# Patient Record
Sex: Female | Born: 1995 | Race: Black or African American | Hispanic: No | Marital: Single | Smoking: Never smoker
Health system: Southern US, Community
[De-identification: ages and names within clinical notes are randomized; demographics above are authoritative.]

## PROBLEM LIST (undated history)

## (undated) DIAGNOSIS — D649 Anemia, unspecified: Secondary | ICD-10-CM

## (undated) DIAGNOSIS — J45909 Unspecified asthma, uncomplicated: Secondary | ICD-10-CM

---

## 2014-07-02 ENCOUNTER — Other Ambulatory Visit: Payer: Self-pay | Admitting: *Deleted

## 2014-07-02 ENCOUNTER — Ambulatory Visit
Admission: RE | Admit: 2014-07-02 | Discharge: 2014-07-02 | Disposition: A | Discharge status: Home / Self Care | Payer: PRIVATE HEALTH INSURANCE | Source: Ambulatory Visit | Attending: *Deleted | Admitting: *Deleted

## 2014-07-02 DIAGNOSIS — R1010 Upper abdominal pain, unspecified: Secondary | ICD-10-CM

## 2014-07-02 DIAGNOSIS — R0789 Other chest pain: Secondary | ICD-10-CM

## 2015-05-10 ENCOUNTER — Encounter (HOSPITAL_COMMUNITY): Payer: Self-pay | Admitting: Emergency Medicine

## 2015-05-10 ENCOUNTER — Emergency Department (INDEPENDENT_AMBULATORY_CARE_PROVIDER_SITE_OTHER)
Admission: EM | Admit: 2015-05-10 | Discharge: 2015-05-10 | Disposition: A | Discharge status: Home / Self Care | Payer: PRIVATE HEALTH INSURANCE | Source: Home / Self Care | Attending: Family Medicine | Admitting: Family Medicine

## 2015-05-10 DIAGNOSIS — J111 Influenza due to unidentified influenza virus with other respiratory manifestations: Secondary | ICD-10-CM

## 2015-05-10 MED ORDER — ONDANSETRON HCL 4 MG PO TABS: 4.0000 mg | ORAL_TABLET | Freq: Four times a day (QID) | ORAL | Status: DC

## 2015-05-10 MED ORDER — OSELTAMIVIR PHOSPHATE 75 MG PO CAPS: 75.0000 mg | ORAL_CAPSULE | Freq: Two times a day (BID) | ORAL | Status: DC

## 2015-05-10 MED ORDER — ACETAMINOPHEN 325 MG PO TABS
ORAL_TABLET | ORAL | Status: AC
  Filled 2015-05-10: qty 2

## 2015-05-10 MED ORDER — ACETAMINOPHEN 325 MG PO TABS
650.0000 mg | ORAL_TABLET | Freq: Once | ORAL | Status: AC
  Administered 2015-05-10: 650 mg via ORAL

## 2015-05-10 NOTE — ED Notes (Signed)
Fever, chills, sweating, body aches, headache.  Complains of nausea, no vomiting, no diarrhea

## 2015-05-10 NOTE — Discharge Instructions (Signed)
Influenza, Adult Drink plenty of liquids and stay well-hydrated. Ibuprofen 600 mg every 6 hours May also alternate with Tylenol every 4 hours. Tamiflu as directed. For nausea may use Zofran every 6 hours as needed. Rest. No classes for the next 2 days minimum. If still having fever and 3 days no classes. Influenza ("the flu") is a viral infection of the respiratory tract. It occurs more often in winter months because people spend more time in close contact with one another. Influenza can make you feel very sick. Influenza easily spreads from person to person (contagious). CAUSES  Influenza is caused by a virus that infects the respiratory tract. You can catch the virus by breathing in droplets from an infected person's cough or sneeze. You can also catch the virus by touching something that was recently contaminated with the virus and then touching your mouth, nose, or eyes. RISKS AND COMPLICATIONS You may be at risk for a more severe case of influenza if you smoke cigarettes, have diabetes, have chronic heart disease (such as heart failure) or lung disease (such as asthma), or if you have a weakened immune system. Elderly people and pregnant women are also at risk for more serious infections. The most common problem of influenza is a lung infection (pneumonia). Sometimes, this problem can require emergency medical care and may be life threatening. SIGNS AND SYMPTOMS  Symptoms typically last 4 to 10 days and may include:  Fever.  Chills.  Headache, body aches, and muscle aches.  Sore throat.  Chest discomfort and cough.  Poor appetite.  Weakness or feeling tired.  Dizziness.  Nausea or vomiting. DIAGNOSIS  Diagnosis of influenza is often made based on your history and a physical exam. A nose or throat swab test can be done to confirm the diagnosis. TREATMENT  In mild cases, influenza goes away on its own. Treatment is directed at relieving symptoms. For more severe cases, your  health care provider may prescribe antiviral medicines to shorten the sickness. Antibiotic medicines are not effective because the infection is caused by a virus, not by bacteria. HOME CARE INSTRUCTIONS  Take medicines only as directed by your health care provider.  Use a cool mist humidifier to make breathing easier.  Get plenty of rest until your temperature returns to normal. This usually takes 3 to 4 days.  Drink enough fluid to keep your urine clear or pale yellow.  Cover yourmouth and nosewhen coughing or sneezing,and wash your handswellto prevent thevirusfrom spreading.  Stay homefromwork orschool untilthe fever is gonefor at least 65full day. PREVENTION  An annual influenza vaccination (flu shot) is the best way to avoid getting influenza. An annual flu shot is now routinely recommended for all adults in the [place] IF:  You experiencechest pain, yourcough worsens,or you producemore mucus.  Youhave nausea,vomiting, ordiarrhea.  Your fever returns or gets worse. SEEK IMMEDIATE MEDICAL CARE IF:  You havetrouble breathing, you become short of breath,or your skin ornails becomebluish.  You have severe painor stiffnessin the neck.  You develop a sudden headache, or pain in the face or ear.  You have nausea or vomiting that you cannot control. MAKE SURE YOU:   Understand these instructions.  Will watch your condition.  Will get help right away if you are not doing well or get worse.   This information is not intended to replace advice given to you by your health care provider. Make sure you discuss any questions you have with your health care provider.  Document Released: 03/03/2000 Document Revised: 03/27/2014 Document Reviewed: 06/05/2011 Elsevier Interactive Patient Education [employer].

## 2015-05-10 NOTE — ED Provider Notes (Signed)
CSN: [id number]     Arrival date & time 05/10/15  1802 History   First MD Initiated Contact with Patient 05/10/15 2000     Chief Complaint  Patient presents with  . Fever   (Consider location/radiation/quality/duration/timing/severity/associated sxs/prior Treatment) HPI Comments: 20 year old female resident of a [place] to be in college is complaining of feeling hot alternating with feeling cold, fever of 101.2, sweats, body aches, headache and nausea without vomiting. Denies abdominal pain. Denies diarrhea. Denies upper respiratory symptoms such as nasal congestion, runny nose or sore throat. Denies dysuria. Did not receive a flu shot this year. Current temp is 102.  Patient is a 20 y.o. female presenting with fever.  Fever Associated symptoms: chest pain, chills, cough, myalgias and nausea   Associated symptoms: no congestion, no ear pain, no rash, no rhinorrhea, no sore throat and no vomiting     History reviewed. No pertinent past medical history. History reviewed. No pertinent past surgical history. No family history on file. Social History  Substance Use Topics  . Smoking status: Never Smoker   . Smokeless tobacco: None  . Alcohol Use: Yes   OB History    No data available     Review of Systems  Constitutional: Positive for fever, chills, activity change, appetite change and fatigue.  HENT: Negative for congestion, ear discharge, ear pain, postnasal drip, rhinorrhea, sneezing and sore throat.   Eyes: Negative.   Respiratory: Positive for cough. Negative for shortness of breath and wheezing.   Cardiovascular: Positive for chest pain.  Gastrointestinal: Positive for nausea. Negative for vomiting and abdominal pain.  Musculoskeletal: Positive for myalgias.  Skin: Negative for pallor and rash.  Neurological: Negative.     Allergies  Review of patient's allergies indicates no known allergies.  Home Medications   Prior to Admission medications   Medication Sig Start  Date End Date Taking? Authorizing Provider  ondansetron (ZOFRAN) 4 MG tablet Take 1 tablet (4 mg total) by mouth every 6 (six) hours. 05/10/15   Janne Napoleon, NP  oseltamivir (TAMIFLU) 75 MG capsule Take 1 capsule (75 mg total) by mouth 2 (two) times daily. X 5 days 05/10/15   Janne Napoleon, NP   Meds Ordered and Administered this Visit   Medications  acetaminophen (TYLENOL) tablet 650 mg (650 mg Oral Given 05/10/15 2005)    BP 121/75 mmHg  Pulse 107  Temp(Src) 102 F (38.9 C) (Oral)  Resp 16  SpO2 100%  LMP 04/26/2015 No data found.   Physical Exam  Constitutional: She is oriented to person, place, and time. She appears well-developed and well-nourished. No distress.  HENT:  Mouth/Throat: No oropharyngeal exudate.  Bilateral TMs are normal. Oropharynx with minor PND.  Eyes: Conjunctivae and EOM are normal.  Neck: Normal range of motion. Neck supple.  Cardiovascular: Regular rhythm, normal heart sounds and intact distal pulses.   Atrial tachycardia at 107  Pulmonary/Chest: Effort normal and breath sounds normal. No respiratory distress. She has no wheezes. She has no rales.  Abdominal: Soft. Bowel sounds are normal. She exhibits no distension and no mass. There is no tenderness. There is no rebound and no guarding.  Musculoskeletal: She exhibits no edema.  Lymphadenopathy:    She has no cervical adenopathy.  Neurological: She is alert and oriented to person, place, and time. No cranial nerve deficit. She exhibits normal muscle tone.  Skin: Skin is warm and dry.  Psychiatric: She has a normal mood and affect.  Nursing note and vitals reviewed.   ED  Course  Procedures (including critical care time)  Labs Review Labs Reviewed - No data to display  Imaging Review No results found.   Visual Acuity Review  Right Eye Distance:   Left Eye Distance:   Bilateral Distance:    Right Eye Near:   Left Eye Near:    Bilateral Near:         MDM   1. Influenza    Influenza,  Adult Drink plenty of liquids and stay well-hydrated. Ibuprofen 600 mg every 6 hours May also alternate with Tylenol every 4 hours. Tamiflu as directed. For nausea may use Zofran every 6 hours as needed. Rest. No classes for the next 2 days minimum. If still having fever and 3 days no classes.    Janne Napoleon, NP 05/10/15 2028

## 2015-05-11 ENCOUNTER — Telehealth (HOSPITAL_COMMUNITY): Payer: Self-pay | Admitting: Emergency Medicine

## 2015-05-11 NOTE — ED Notes (Signed)
Pt called stating Rx were not e-Rx... Notified pt that the Rx were given to her on paper and she should have them Adv pt to call if she can't find them... Pt verb understanding.

## 2016-01-24 ENCOUNTER — Encounter (HOSPITAL_COMMUNITY): Payer: Self-pay | Admitting: Emergency Medicine

## 2016-01-24 ENCOUNTER — Ambulatory Visit (INDEPENDENT_AMBULATORY_CARE_PROVIDER_SITE_OTHER): Payer: 59

## 2016-01-24 ENCOUNTER — Ambulatory Visit (HOSPITAL_COMMUNITY)
Admission: EM | Admit: 2016-01-24 | Discharge: 2016-01-24 | Disposition: A | Discharge status: Home / Self Care | Payer: 59 | Attending: Family Medicine | Admitting: Family Medicine

## 2016-01-24 DIAGNOSIS — N939 Abnormal uterine and vaginal bleeding, unspecified: Secondary | ICD-10-CM

## 2016-01-24 DIAGNOSIS — R0789 Other chest pain: Secondary | ICD-10-CM | POA: Diagnosis not present

## 2016-01-24 LAB — POCT URINALYSIS DIP (DEVICE)
Bilirubin Urine: NEGATIVE
Glucose, UA: NEGATIVE mg/dL
KETONES UR: NEGATIVE mg/dL
Nitrite: NEGATIVE
PROTEIN: NEGATIVE mg/dL
SPECIFIC GRAVITY, URINE: 1.02 (ref 1.005–1.030)
UROBILINOGEN UA: 0.2 mg/dL (ref 0.0–1.0)
pH: 7.5 (ref 5.0–8.0)

## 2016-01-24 LAB — POCT I-STAT, CHEM 8
BUN: 4 mg/dL — AB (ref 6–20)
CALCIUM ION: 1.18 mmol/L (ref 1.15–1.40)
CHLORIDE: 102 mmol/L (ref 101–111)
Creatinine, Ser: 0.7 mg/dL (ref 0.44–1.00)
GLUCOSE: 94 mg/dL (ref 65–99)
HCT: 38 % (ref 36.0–46.0)
Hemoglobin: 12.9 g/dL (ref 12.0–15.0)
Potassium: 3.9 mmol/L (ref 3.5–5.1)
Sodium: 141 mmol/L (ref 135–145)
TCO2: 25 mmol/L (ref 0–100)

## 2016-01-24 LAB — POCT PREGNANCY, URINE: Preg Test, Ur: NEGATIVE

## 2016-01-24 MED ORDER — NAPROXEN 500 MG PO TABS: 500.0000 mg | ORAL_TABLET | Freq: Two times a day (BID) | ORAL | 0 refills | Status: DC

## 2016-01-24 MED ORDER — IBUPROFEN 800 MG PO TABS
ORAL_TABLET | ORAL | Status: AC
  Filled 2016-01-24: qty 1

## 2016-01-24 MED ORDER — CYCLOBENZAPRINE HCL 10 MG PO TABS: 10.0000 mg | ORAL_TABLET | Freq: Two times a day (BID) | ORAL | 0 refills | Status: DC | PRN

## 2016-01-24 MED ORDER — IBUPROFEN 800 MG PO TABS
800.0000 mg | ORAL_TABLET | Freq: Once | ORAL | Status: AC
  Administered 2016-01-24: 800 mg via ORAL

## 2016-01-24 NOTE — ED Triage Notes (Signed)
Patient reports pain under right breast with deep inspiration, movement and coughing.  Pain for 2 days.  Denies any injury.  Reports right shoulder pain.    Patient reports vaginal bleeding for two weeks.  Patient had a period in the beginning of October.  Patient is not on any birth control, reports no birth control pills in 1 1/2 years.  Patient reports this bleeding is abnormal for her.

## 2016-01-24 NOTE — ED Provider Notes (Signed)
CSN: [id number]     Arrival date & time 01/24/16  1046 History   First MD Initiated Contact with Patient 01/24/16 1307     No chief complaint on file.  (Consider location/radiation/quality/duration/timing/severity/associated sxs/prior Treatment) HPI Sonia James is a 20 y.o. female presenting to UC with c/o Right sided chest pain for about 2 days.  Pain is aching and sharp, worse with certain movements, palpation, cough and sneeze.  She reports mild intermittent chronic cough. No fever, chills, n/v/d. Denies recent injury of fall, heavy lifting or being hit in side. No hx of blood clots. Denies leg pain or swelling.  She also reports 2 week long vaginal bleeding and notes her cycles typically only last 4-5 days.  She has had heavier bleeding than normal as well as worsening menstrual cramps. Denies abdominal pain or lower back pain at this time. Denies concern for STI.    History reviewed. No pertinent past medical history. History reviewed. No pertinent surgical history. No family history on file. Social History  Substance Use Topics  . Smoking status: Never Smoker  . Smokeless tobacco: Not on file  . Alcohol use Yes   OB History    No data available     Review of Systems  Constitutional: Negative for chills and fever.  HENT: Positive for congestion ( minimal). Negative for ear pain, sore throat, trouble swallowing and voice change.   Respiratory: Positive for cough. Negative for shortness of breath.   Cardiovascular: Positive for chest pain (Right side, chest wall). Negative for palpitations.  Gastrointestinal: Negative for abdominal pain, diarrhea, nausea and vomiting.  Genitourinary: Positive for menstrual problem (heavier and longer than usual) and vaginal bleeding. Negative for dysuria, flank pain, frequency, hematuria, pelvic pain, urgency, vaginal discharge and vaginal pain.  Musculoskeletal: Negative for arthralgias, back pain and myalgias.  Skin: Negative for rash.     Allergies  Patient has no known allergies.  Home Medications   Prior to Admission medications   Medication Sig Start Date End Date Taking? Authorizing Provider  cyclobenzaprine (FLEXERIL) 10 MG tablet Take 1 tablet (10 mg total) by mouth 2 (two) times daily as needed for muscle spasms. 01/24/16   Noland Fordyce, PA-C  naproxen (NAPROSYN) 500 MG tablet Take 1 tablet (500 mg total) by mouth 2 (two) times daily. 01/24/16   Noland Fordyce, PA-C  ondansetron (ZOFRAN) 4 MG tablet Take 1 tablet (4 mg total) by mouth every 6 (six) hours. Patient not taking: Reported on 01/24/2016 05/10/15   Janne Napoleon, NP  oseltamivir (TAMIFLU) 75 MG capsule Take 1 capsule (75 mg total) by mouth 2 (two) times daily. X 5 days Patient not taking: Reported on 01/24/2016 05/10/15   Janne Napoleon, NP   Meds Ordered and Administered this Visit   Medications  ibuprofen (ADVIL,MOTRIN) tablet 800 mg (800 mg Oral Given 01/24/16 1322)    BP 151/85 (BP Location: Right Arm)   Pulse 83   Temp 99.1 F (37.3 C) (Oral)   Resp 16   LMP 12/27/2015   SpO2 100%  No data found.   Physical Exam  Constitutional: She appears well-developed and well-nourished. No distress.  HENT:  Head: Normocephalic and atraumatic.  Eyes: Conjunctivae are normal. No scleral icterus.  Neck: Normal range of motion.  Cardiovascular: Normal rate, regular rhythm and normal heart sounds.   Pulmonary/Chest: Effort normal and breath sounds normal. No respiratory distress. She has no decreased breath sounds. She has no wheezes. She has no rhonchi. She has no rales. She exhibits  tenderness.    No respiratory distress. Lungs: CTAB No chest wall deformity or crepitus. Tenderness to Right side chest wall.  Abdominal: Soft. Bowel sounds are normal. She exhibits no distension and no mass. There is no tenderness. There is no rebound and no guarding.  Musculoskeletal: Normal range of motion. She exhibits tenderness. She exhibits no edema.  Right side chest  wall, and Right upper back muscle tenderness.  Neurological: She is alert.  Skin: Skin is warm and dry. Capillary refill takes less than 2 seconds. No rash noted. She is not diaphoretic. No erythema.  Nursing note and vitals reviewed.   Urgent Care Course   Clinical Course     Procedures (including critical care time)  Labs Review Labs Reviewed  POCT I-STAT, CHEM 8 - Abnormal; Notable for the following:       Result Value   BUN 4 (*)    All other components within normal limits  POCT PREGNANCY, URINE    Imaging Review Dg Ribs Unilateral W/chest Right  Result Date: 01/24/2016 CLINICAL DATA:  Sharp pains under the ribcage on the right for the past 2 days associated with cough. No known injury. EXAM: RIGHT RIBS AND CHEST - 3+ VIEW COMPARISON:  None in PACs FINDINGS: The lungs are well-expanded and clear. The heart and pulmonary vascularity are normal. The mediastinum is normal in width. There is no pleural effusion, pneumothorax, or pneumomediastinum. Right rib detail images reveal no acute or healing fracture. No lytic or blastic right rib fracture is observed. IMPRESSION: No active cardiopulmonary disease. The observed right ribs exhibit no acute abnormalities. Electronically Signed   By: David  Martinique M.D.   On: 01/24/2016 13:46      MDM   1. Right-sided chest wall pain   2. Abnormal vaginal bleeding     Pt c/o Right side chest wall pain but denies SOB. No hx of blood clots. PERC negative  Mild cough and congestion. Pt also c/o vaginal bleeding longer than normal menstrual cycle.  Hx of anemia but denies dizziness or chest pain.  UA: not concerning for UTI Urine preg: Negative  istat chem 8: unremarkable. Normal Hgb/Hct. CXR: WNL  Discussed labs and imaging with pt. Encouraged f/u with PCP in 1 week as well as f/u with OB/GYN for further evaluation and treatment of vaginal bleeding.    Noland Fordyce, PA-C 01/24/16 1400

## 2016-01-24 NOTE — ED Notes (Signed)
Urinalysis results not crossing over due to time change: Results as follows: GLU-Neg, BIL-Neg, KET-Neg, SG-1.020, BLO-Moderate, pH-7.5, PRO-Neg, URO-0.2 E.U./dL, NIT-Neg, LEU-Small. Results were printed and shown to provider.

## 2016-02-02 ENCOUNTER — Ambulatory Visit (HOSPITAL_COMMUNITY)
Admission: EM | Admit: 2016-02-02 | Discharge: 2016-02-02 | Disposition: A | Discharge status: Home / Self Care | Payer: 59 | Attending: Emergency Medicine | Admitting: Emergency Medicine

## 2016-02-02 ENCOUNTER — Ambulatory Visit (INDEPENDENT_AMBULATORY_CARE_PROVIDER_SITE_OTHER): Payer: 59

## 2016-02-02 ENCOUNTER — Ambulatory Visit (HOSPITAL_COMMUNITY): Payer: 59

## 2016-02-02 ENCOUNTER — Encounter (HOSPITAL_COMMUNITY): Payer: Self-pay | Admitting: Emergency Medicine

## 2016-02-02 DIAGNOSIS — R1031 Right lower quadrant pain: Secondary | ICD-10-CM

## 2016-02-02 DIAGNOSIS — R1013 Epigastric pain: Secondary | ICD-10-CM

## 2016-02-02 LAB — POCT URINALYSIS DIP (DEVICE)
Bilirubin Urine: NEGATIVE
Glucose, UA: NEGATIVE mg/dL
KETONES UR: NEGATIVE mg/dL
Nitrite: NEGATIVE
PH: 7 (ref 5.0–8.0)
PROTEIN: NEGATIVE mg/dL
SPECIFIC GRAVITY, URINE: 1.025 (ref 1.005–1.030)
Urobilinogen, UA: 0.2 mg/dL (ref 0.0–1.0)

## 2016-02-02 LAB — POCT PREGNANCY, URINE: PREG TEST UR: NEGATIVE

## 2016-02-02 NOTE — ED Provider Notes (Signed)
CSN: [id number]     Arrival date & time 02/02/16  [id number] History   First MD Initiated Contact with Patient 02/02/16 1025     Chief Complaint  Patient presents with  . Abdominal Pain   (Consider location/radiation/quality/duration/timing/severity/associated sxs/prior Treatment) Sonia James is a well-appearing 20 y.o female, presents today for abdominal pain at the epigastric area and at the RLQ area. Abdominal pain have been present for 2 days. Onset was sudden, and patient reports the pain is getting worst. Patient describes the pain as stabbing and constant. Patient rates her pain as 7/10 at rest and 9/10 with movements. Patient reports the pain is worst with movements such as laying down or getting up from a supine position. Patient endorses 2-3 episodes of loose diarrhea for the past 2 days. She denies nausea or vomiting. She also denies fever, dysuria, urinary frequency, flank pain, vaginal bleeding or discharge.       History reviewed. No pertinent past medical history. History reviewed. No pertinent surgical history. No family history on file. Social History  Substance Use Topics  . Smoking status: Never Smoker  . Smokeless tobacco: Not on file  . Alcohol use Yes   OB History    No data available     Review of Systems  Constitutional: Negative for chills, fatigue and fever.  HENT: Positive for congestion and rhinorrhea. Negative for sneezing.   Respiratory: Positive for cough. Negative for shortness of breath.   Cardiovascular: Negative for chest pain and leg swelling.  Gastrointestinal: Positive for abdominal pain and diarrhea. Negative for nausea and vomiting.  Genitourinary: Negative for dysuria, flank pain and vaginal bleeding.  Skin: Negative for rash.  Neurological: Negative for syncope and headaches.    Allergies  Patient has no known allergies.  Home Medications   Prior to Admission medications   Medication Sig Start Date End Date Taking? Authorizing Provider   cyclobenzaprine (FLEXERIL) 10 MG tablet Take 1 tablet (10 mg total) by mouth 2 (two) times daily as needed for muscle spasms. 01/24/16   Noland Fordyce, PA-C  naproxen (NAPROSYN) 500 MG tablet Take 1 tablet (500 mg total) by mouth 2 (two) times daily. 01/24/16   Noland Fordyce, PA-C  ondansetron (ZOFRAN) 4 MG tablet Take 1 tablet (4 mg total) by mouth every 6 (six) hours. Patient not taking: Reported on 01/24/2016 05/10/15   Janne Napoleon, NP  oseltamivir (TAMIFLU) 75 MG capsule Take 1 capsule (75 mg total) by mouth 2 (two) times daily. X 5 days Patient not taking: Reported on 01/24/2016 05/10/15   Janne Napoleon, NP   Meds Ordered and Administered this Visit  Medications - No data to display  BP 132/77 (BP Location: Right Arm)   Pulse 100   Temp 99.1 F (37.3 C) (Oral)   Resp 16   LMP 12/27/2015   SpO2 100%  No data found.   Physical Exam  Constitutional: She is oriented to person, place, and time. She appears well-developed and well-nourished.  HENT:  Head: Normocephalic and atraumatic.  Right Ear: External ear normal.  Left Ear: External ear normal.  Nose: Nose normal.  Mouth/Throat: Oropharynx is clear and moist. No oropharyngeal exudate.  TM normal bilaterally  Eyes: Conjunctivae and EOM are normal. Pupils are equal, round, and reactive to light.  Neck: Normal range of motion. Neck supple.  Cardiovascular: Normal rate, regular rhythm and normal heart sounds.   Pulmonary/Chest: Effort normal and breath sounds normal. No respiratory distress.  Abdominal: Soft. Bowel sounds are normal. She exhibits no  distension. There is tenderness.  Generalized tenderness present with palpation  Lymphadenopathy:    She has no cervical adenopathy.  Neurological: She is alert and oriented to person, place, and time.  Skin: Skin is warm and dry.    Urgent Care Course   Clinical Course     Procedures (including critical care time)  Labs Review Labs Reviewed  POCT URINALYSIS DIP (DEVICE) -  Abnormal; Notable for the following:       Result Value   Hgb urine dipstick TRACE (*)    Leukocytes, UA SMALL (*)    All other components within normal limits  URINE CULTURE  POCT PREGNANCY, URINE    Imaging Review Dg Abd 2 Views  Result Date: 02/02/2016 CLINICAL DATA:  Acute right lower quadrant abdominal pain. EXAM: ABDOMEN - 2 VIEW COMPARISON:  None. FINDINGS: The bowel gas pattern is normal. There is no evidence of free air. No radio-opaque calculi or other significant radiographic abnormality is seen. IMPRESSION: No evidence of bowel obstruction or ileus. Electronically Signed   By: Marijo Conception, M.D.   On: 02/02/2016 11:47    MDM   1. Epigastric pain   2. Right lower quadrant abdominal pain    DG Abd 2 Views unremarkable. UA has trace of hgb and small amount of leukocytes. Patient denies urinary symptoms. Urine culture ordered and is pending; will hold off abx until culture comes back. Patient safe to be discharge home. Instructed to f/u with student health services if abdominal pain does not resolved. Informed to go to ER if pain gets worst or if new symptoms develop such as fever or vomiting. Patient states understanding. Discharge paperwork given.      Barry Dienes, NP 02/02/16 [phone]

## 2016-02-02 NOTE — ED Triage Notes (Signed)
Patient reports yellowish /green diarrhea and then blackish/brown color of diarrhea, alternating.  Has epigastric pain and lower right quadrant pain.  Denies urinary symptoms.  No vomiting

## 2016-02-02 NOTE — Discharge Instructions (Signed)
Your abdominal xray is negative. Your are not pregnant. Your urine may indicate a possible urinary tract infection however you are asymptomatic and I don't think you have an UTI, and we will send your urine off for a urine culture to confirm this. If culture comes back positive for UTI, then we will call you in an antibiotic. We will only call you if the culture comes back positive. I feel safe to discharge you home. If your pain does not improve, then you need to follow up with the student health services. If you pain gets worst, or if you starts to vomit, then you need to go to the ER.

## 2018-04-23 IMAGING — DX DG RIBS W/ CHEST 3+V*R*
4 series · 4 of 4 positions shown · non-contrast
Comparison: None in PACs

CLINICAL DATA: Sharp pains under the ribcage on the right for the
past 2 days associated with cough. No known injury.

EXAM:
RIGHT RIBS AND CHEST - 3+ VIEW

[chest pa]
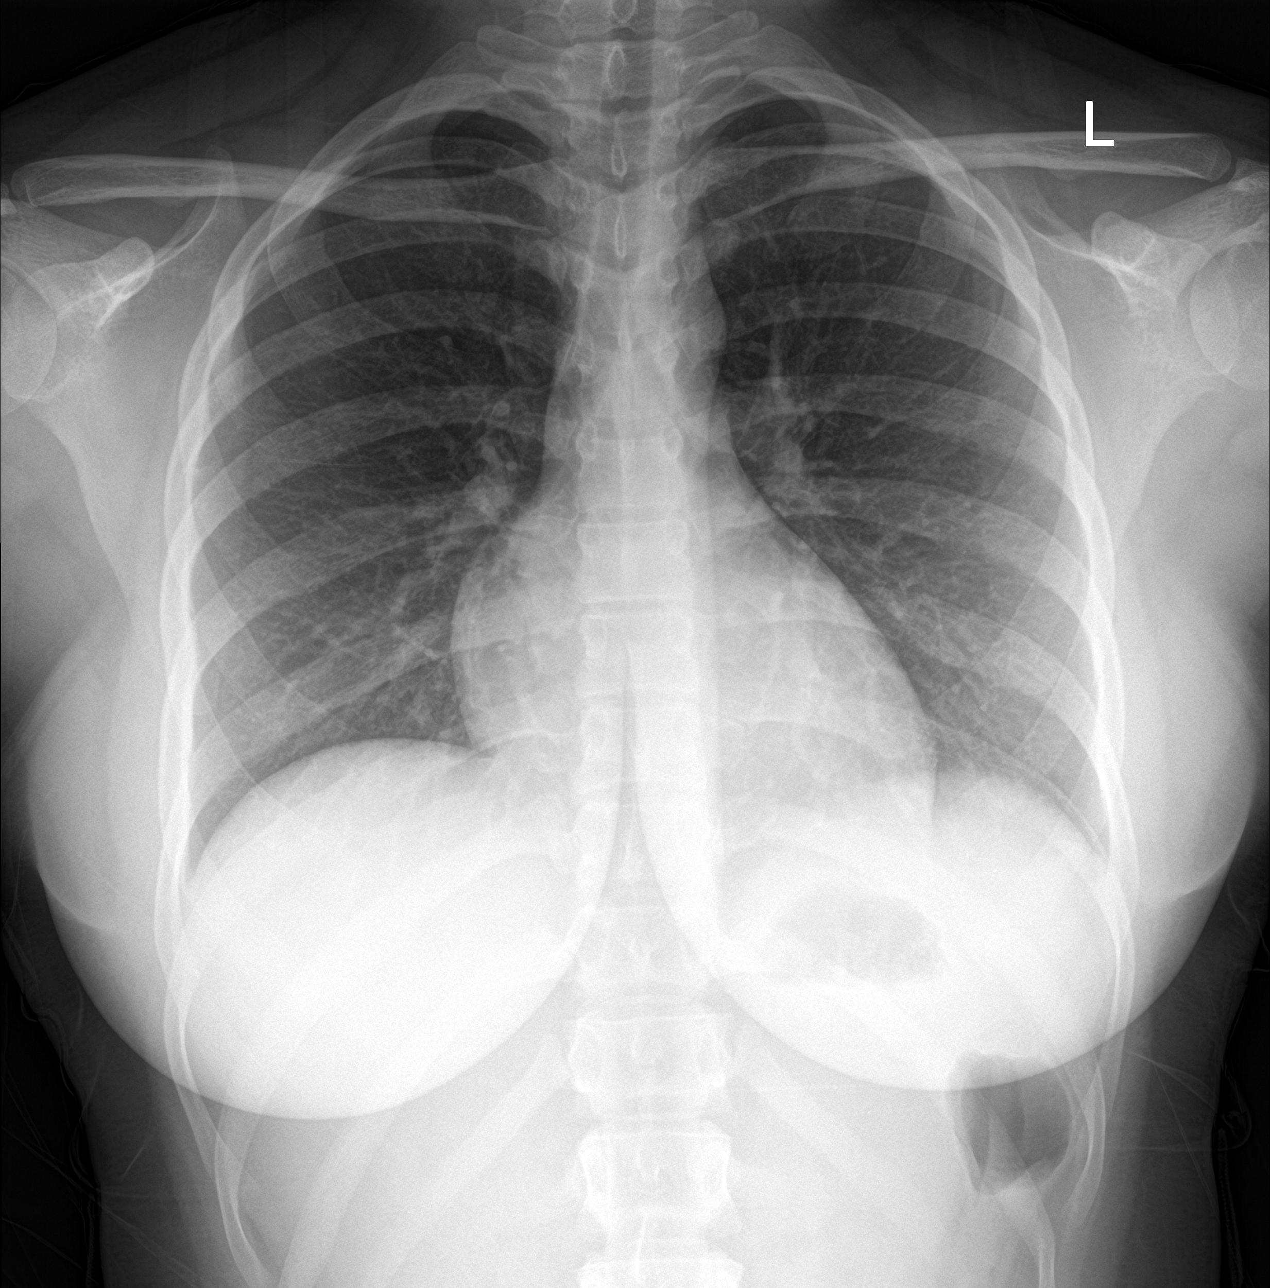

[rib pa]
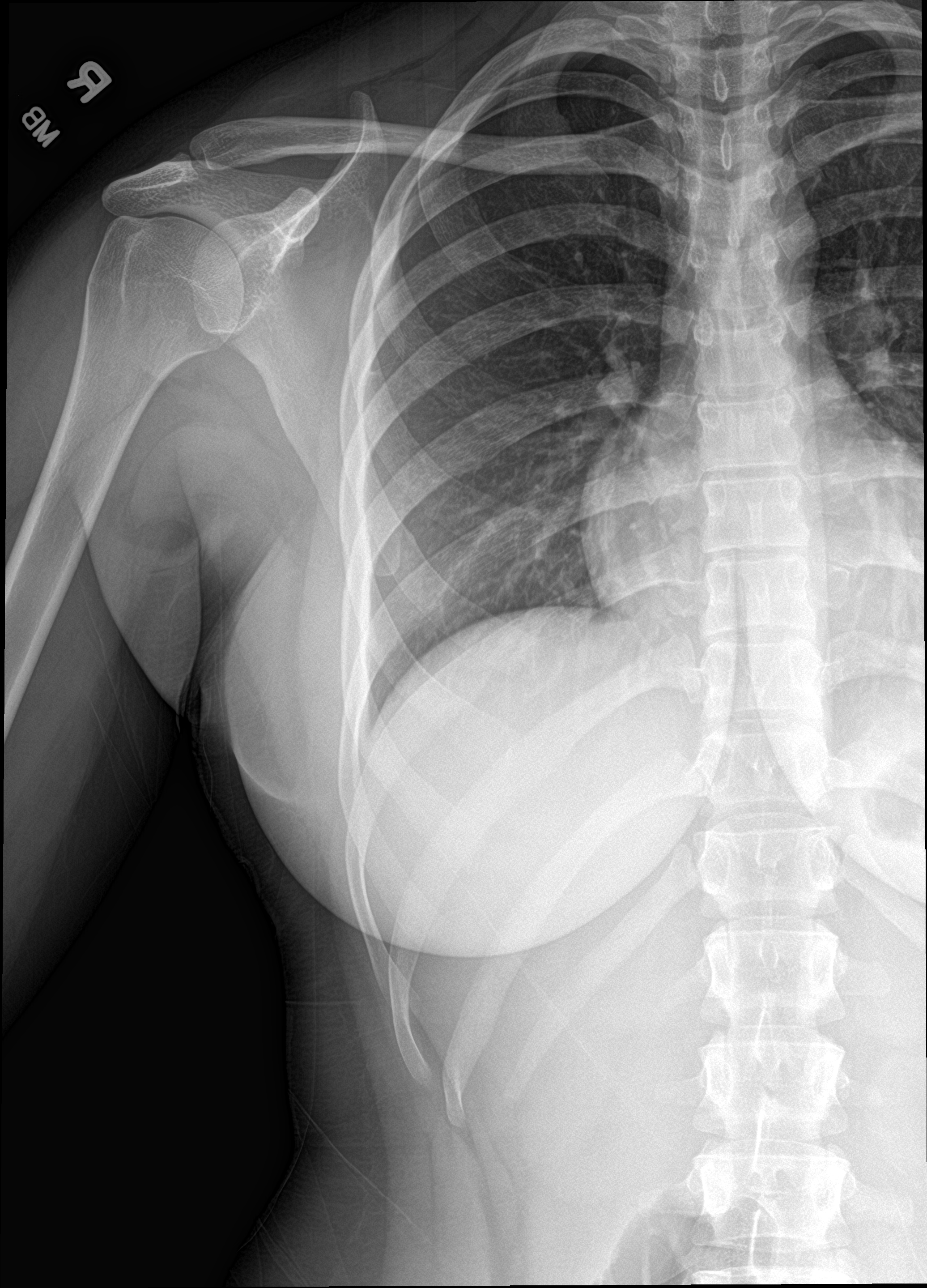

[rib obl]
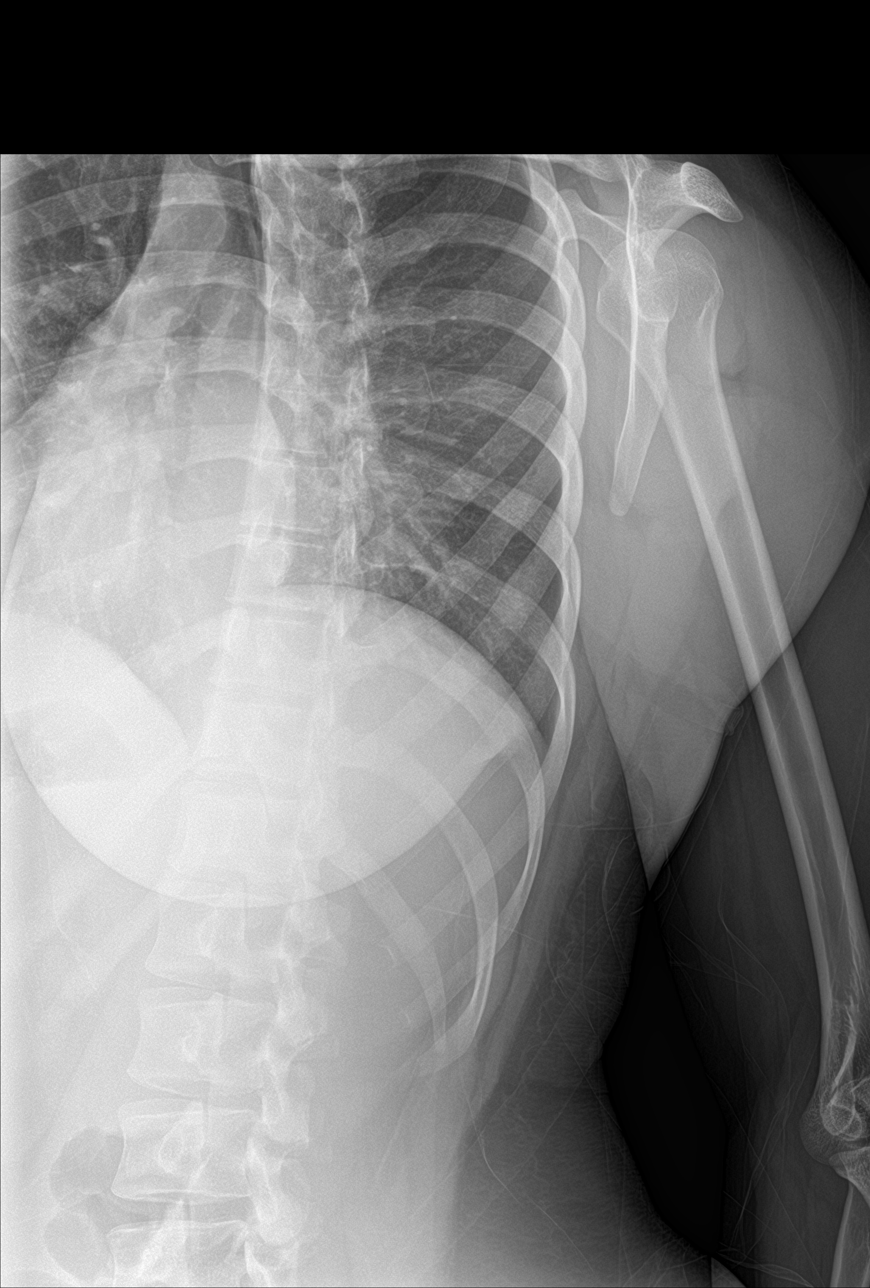

[chest ap]
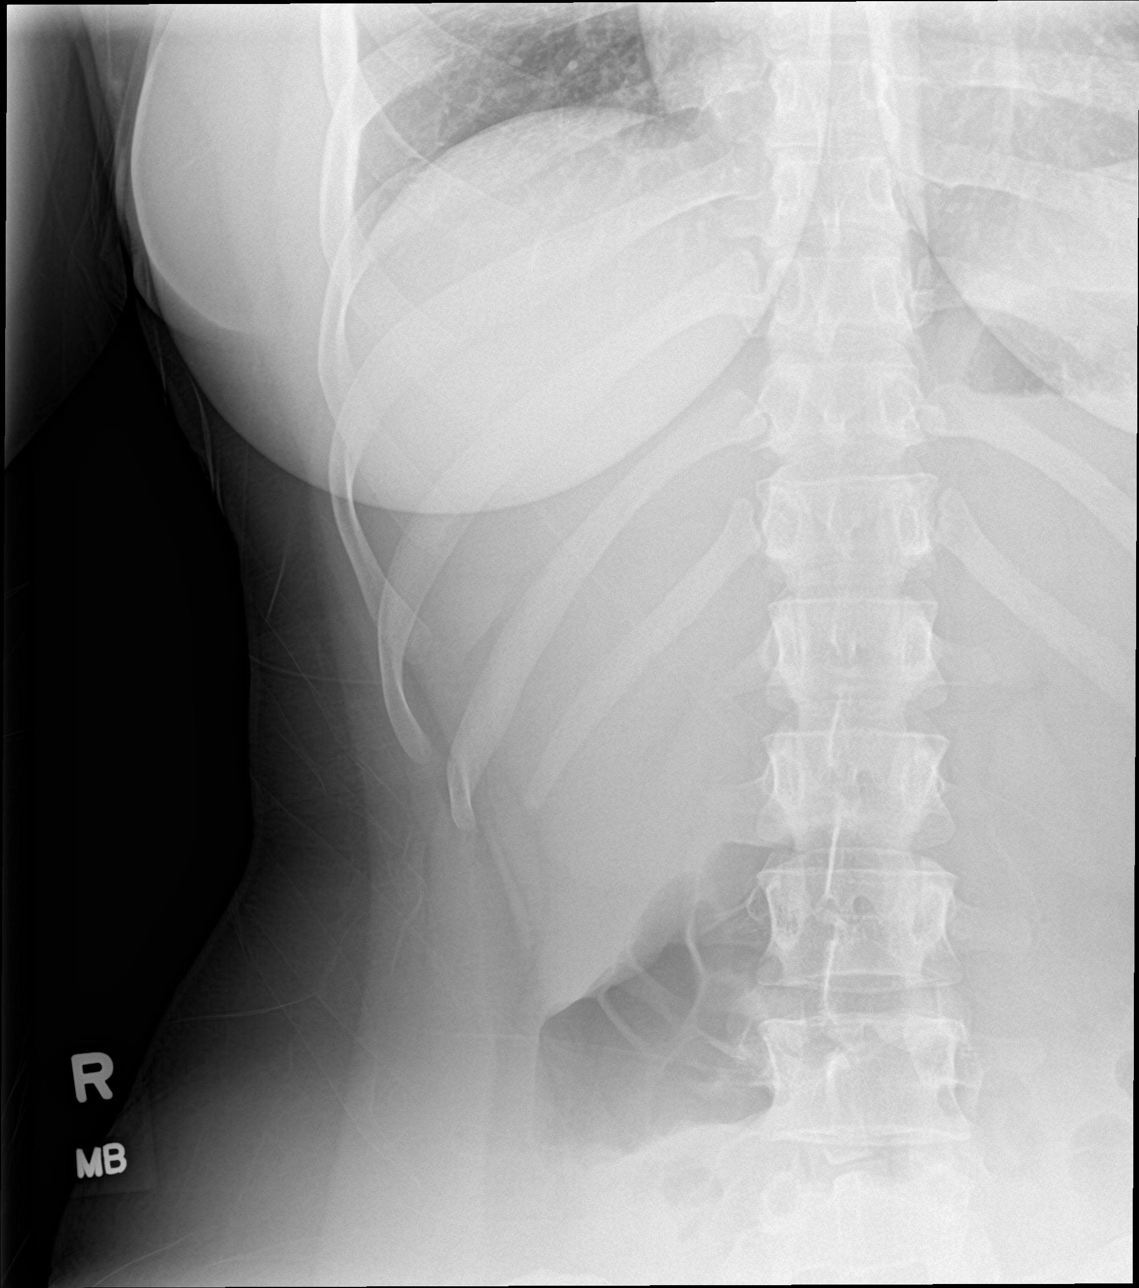

[4 of 4 positions shown; findings below may reference images not displayed]

FINDINGS: The lungs are well-expanded and clear. The heart and pulmonary
vascularity are normal. The mediastinum is normal in width. There is
no pleural effusion, pneumothorax, or pneumomediastinum.

Right rib detail images reveal no acute or healing fracture. No
lytic or blastic right rib fracture is observed.
IMPRESSION: No active cardiopulmonary disease. The observed right ribs exhibit
no acute abnormalities.

## 2020-03-29 ENCOUNTER — Inpatient Hospital Stay (HOSPITAL_COMMUNITY)
Admission: AD | Admit: 2020-03-29 | Discharge: 2020-03-31 | DRG: 787 | Disposition: A | Discharge status: Home / Self Care | Payer: Medicaid Other | Attending: Family Medicine | Admitting: Family Medicine

## 2020-03-29 ENCOUNTER — Inpatient Hospital Stay (HOSPITAL_BASED_OUTPATIENT_CLINIC_OR_DEPARTMENT_OTHER): Payer: Medicaid Other

## 2020-03-29 ENCOUNTER — Encounter (HOSPITAL_COMMUNITY): Payer: Self-pay | Admitting: Obstetrics and Gynecology

## 2020-03-29 ENCOUNTER — Inpatient Hospital Stay (HOSPITAL_COMMUNITY): Payer: Medicaid Other | Admitting: Certified Registered Nurse Anesthetist

## 2020-03-29 ENCOUNTER — Encounter (HOSPITAL_COMMUNITY)
Admission: AD | Disposition: A | Discharge status: Home / Self Care | Payer: Self-pay | Source: Home / Self Care | Attending: Family Medicine

## 2020-03-29 ENCOUNTER — Other Ambulatory Visit: Payer: Self-pay

## 2020-03-29 DIAGNOSIS — O9832 Other infections with a predominantly sexual mode of transmission complicating childbirth: Secondary | ICD-10-CM | POA: Diagnosis present

## 2020-03-29 DIAGNOSIS — O321XX0 Maternal care for breech presentation, not applicable or unspecified: Secondary | ICD-10-CM | POA: Diagnosis present

## 2020-03-29 DIAGNOSIS — O480 Post-term pregnancy: Principal | ICD-10-CM | POA: Diagnosis present

## 2020-03-29 DIAGNOSIS — Z30430 Encounter for insertion of intrauterine contraceptive device: Secondary | ICD-10-CM

## 2020-03-29 DIAGNOSIS — D259 Leiomyoma of uterus, unspecified: Secondary | ICD-10-CM | POA: Diagnosis present

## 2020-03-29 DIAGNOSIS — Z975 Presence of (intrauterine) contraceptive device: Secondary | ICD-10-CM | POA: Diagnosis not present

## 2020-03-29 DIAGNOSIS — A5602 Chlamydial vulvovaginitis: Secondary | ICD-10-CM | POA: Diagnosis present

## 2020-03-29 DIAGNOSIS — O3413 Maternal care for benign tumor of corpus uteri, third trimester: Secondary | ICD-10-CM | POA: Diagnosis present

## 2020-03-29 DIAGNOSIS — A5901 Trichomonal vulvovaginitis: Secondary | ICD-10-CM | POA: Diagnosis present

## 2020-03-29 DIAGNOSIS — O99214 Obesity complicating childbirth: Secondary | ICD-10-CM | POA: Diagnosis present

## 2020-03-29 DIAGNOSIS — O0933 Supervision of pregnancy with insufficient antenatal care, third trimester: Secondary | ICD-10-CM

## 2020-03-29 DIAGNOSIS — Z23 Encounter for immunization: Secondary | ICD-10-CM | POA: Diagnosis not present

## 2020-03-29 DIAGNOSIS — A599 Trichomoniasis, unspecified: Secondary | ICD-10-CM

## 2020-03-29 DIAGNOSIS — Z3A41 41 weeks gestation of pregnancy: Secondary | ICD-10-CM

## 2020-03-29 DIAGNOSIS — Z98891 History of uterine scar from previous surgery: Secondary | ICD-10-CM | POA: Diagnosis not present

## 2020-03-29 DIAGNOSIS — O9833 Other infections with a predominantly sexual mode of transmission complicating the puerperium: Secondary | ICD-10-CM | POA: Diagnosis not present

## 2020-03-29 DIAGNOSIS — A749 Chlamydial infection, unspecified: Secondary | ICD-10-CM | POA: Diagnosis not present

## 2020-03-29 DIAGNOSIS — Z20822 Contact with and (suspected) exposure to covid-19: Secondary | ICD-10-CM | POA: Diagnosis present

## 2020-03-29 DIAGNOSIS — O0930 Supervision of pregnancy with insufficient antenatal care, unspecified trimester: Secondary | ICD-10-CM

## 2020-03-29 HISTORY — DX: Anemia, unspecified: D64.9

## 2020-03-29 LAB — DIFFERENTIAL
Abs Immature Granulocytes: 0.06 10*3/uL (ref 0.00–0.07)
Basophils Absolute: 0 10*3/uL (ref 0.0–0.1)
Basophils Relative: 0 %
Eosinophils Absolute: 0 10*3/uL (ref 0.0–0.5)
Eosinophils Relative: 0 %
Immature Granulocytes: 1 %
Lymphocytes Relative: 18 %
Lymphs Abs: 1.8 10*3/uL (ref 0.7–4.0)
Monocytes Absolute: 0.7 10*3/uL (ref 0.1–1.0)
Monocytes Relative: 7 %
Neutro Abs: 7.5 10*3/uL (ref 1.7–7.7)
Neutrophils Relative %: 74 %

## 2020-03-29 LAB — RESP PANEL BY RT-PCR (FLU A&B, COVID) ARPGX2
Influenza A by PCR: NEGATIVE
Influenza B by PCR: NEGATIVE
SARS Coronavirus 2 by RT PCR: NEGATIVE

## 2020-03-29 LAB — GLUCOSE, CAPILLARY: Glucose-Capillary: 64 mg/dL — ABNORMAL LOW (ref 70–99)

## 2020-03-29 LAB — TYPE AND SCREEN
ABO/RH(D): O POS
Antibody Screen: NEGATIVE

## 2020-03-29 LAB — URINALYSIS, ROUTINE W REFLEX MICROSCOPIC
Bilirubin Urine: NEGATIVE
Glucose, UA: NEGATIVE mg/dL
Hgb urine dipstick: NEGATIVE
Ketones, ur: NEGATIVE mg/dL
Nitrite: NEGATIVE
Protein, ur: NEGATIVE mg/dL
Specific Gravity, Urine: 1.018 (ref 1.005–1.030)
pH: 6 (ref 5.0–8.0)

## 2020-03-29 LAB — HEPATITIS B SURFACE ANTIGEN: Hepatitis B Surface Ag: NONREACTIVE

## 2020-03-29 LAB — CBC
HCT: 33.9 % — ABNORMAL LOW (ref 36.0–46.0)
Hemoglobin: 10.4 g/dL — ABNORMAL LOW (ref 12.0–15.0)
MCH: 24.9 pg — ABNORMAL LOW (ref 26.0–34.0)
MCHC: 30.7 g/dL (ref 30.0–36.0)
MCV: 81.1 fL (ref 80.0–100.0)
Platelets: 454 10*3/uL — ABNORMAL HIGH (ref 150–400)
RBC: 4.18 MIL/uL (ref 3.87–5.11)
RDW: 16.5 % — ABNORMAL HIGH (ref 11.5–15.5)
WBC: 10 10*3/uL (ref 4.0–10.5)
nRBC: 0 % (ref 0.0–0.2)

## 2020-03-29 LAB — HEMOGLOBIN A1C
Hgb A1c MFr Bld: 5.4 % (ref 4.8–5.6)
Mean Plasma Glucose: 108.28 mg/dL

## 2020-03-29 LAB — HIV ANTIBODY (ROUTINE TESTING W REFLEX): HIV Screen 4th Generation wRfx: NONREACTIVE

## 2020-03-29 SURGERY — Surgical Case
Anesthesia: Spinal | Site: Abdomen | Wound class: Clean Contaminated

## 2020-03-29 MED ORDER — KETOROLAC TROMETHAMINE 30 MG/ML IJ SOLN: 30.0000 mg | Freq: Four times a day (QID) | INTRAMUSCULAR | Status: DC | PRN

## 2020-03-29 MED ORDER — DIPHENHYDRAMINE HCL 25 MG PO CAPS: 25.0000 mg | ORAL_CAPSULE | ORAL | Status: DC | PRN

## 2020-03-29 MED ORDER — FENTANYL CITRATE (PF) 100 MCG/2ML IJ SOLN
INTRAMUSCULAR | Status: DC | PRN
  Administered 2020-03-29: 35 ug via INTRAVENOUS

## 2020-03-29 MED ORDER — MENTHOL 3 MG MT LOZG: 1.0000 | LOZENGE | OROMUCOSAL | Status: DC | PRN

## 2020-03-29 MED ORDER — NALBUPHINE HCL 10 MG/ML IJ SOLN: 5.0000 mg | INTRAMUSCULAR | Status: DC | PRN

## 2020-03-29 MED ORDER — COCONUT OIL OIL: 1.0000 "application " | TOPICAL_OIL | Status: DC | PRN

## 2020-03-29 MED ORDER — DEXAMETHASONE SODIUM PHOSPHATE 4 MG/ML IJ SOLN
INTRAMUSCULAR | Status: AC
  Filled 2020-03-29: qty 1

## 2020-03-29 MED ORDER — ONDANSETRON HCL 4 MG/2ML IJ SOLN: 4.0000 mg | Freq: Three times a day (TID) | INTRAMUSCULAR | Status: DC | PRN

## 2020-03-29 MED ORDER — ONDANSETRON HCL 4 MG/2ML IJ SOLN
INTRAMUSCULAR | Status: DC | PRN
  Administered 2020-03-29: 4 mg via INTRAVENOUS

## 2020-03-29 MED ORDER — IBUPROFEN 800 MG PO TABS
800.0000 mg | ORAL_TABLET | Freq: Three times a day (TID) | ORAL | Status: DC
  Administered 2020-03-30 – 2020-03-31 (×4): 800 mg via ORAL
  Filled 2020-03-29 (×4): qty 1

## 2020-03-29 MED ORDER — NALBUPHINE HCL 10 MG/ML IJ SOLN: 5.0000 mg | Freq: Once | INTRAMUSCULAR | Status: DC | PRN

## 2020-03-29 MED ORDER — PHENYLEPHRINE HCL-NACL 20-0.9 MG/250ML-% IV SOLN
INTRAVENOUS | Status: DC | PRN
  Administered 2020-03-29: 60 ug/min via INTRAVENOUS

## 2020-03-29 MED ORDER — DIPHENHYDRAMINE HCL 50 MG/ML IJ SOLN: 12.5000 mg | INTRAMUSCULAR | Status: DC | PRN

## 2020-03-29 MED ORDER — OXYTOCIN-SODIUM CHLORIDE 30-0.9 UT/500ML-% IV SOLN
INTRAVENOUS | Status: AC
  Filled 2020-03-29: qty 500

## 2020-03-29 MED ORDER — SODIUM CHLORIDE 0.9% FLUSH: 3.0000 mL | INTRAVENOUS | Status: DC | PRN

## 2020-03-29 MED ORDER — MEPERIDINE HCL 25 MG/ML IJ SOLN: 6.2500 mg | INTRAMUSCULAR | Status: DC | PRN

## 2020-03-29 MED ORDER — KETOROLAC TROMETHAMINE 30 MG/ML IJ SOLN
INTRAMUSCULAR | Status: AC
  Filled 2020-03-29: qty 1

## 2020-03-29 MED ORDER — LACTATED RINGERS IV SOLN: INTRAVENOUS | Status: DC | PRN

## 2020-03-29 MED ORDER — OXYTOCIN-SODIUM CHLORIDE 30-0.9 UT/500ML-% IV SOLN
INTRAVENOUS | Status: DC | PRN
  Administered 2020-03-29: 400 mL via INTRAVENOUS

## 2020-03-29 MED ORDER — MEPERIDINE HCL 25 MG/ML IJ SOLN
INTRAMUSCULAR | Status: DC | PRN
  Administered 2020-03-29 (×2): 12.5 mg via INTRAVENOUS

## 2020-03-29 MED ORDER — SOD CITRATE-CITRIC ACID 500-334 MG/5ML PO SOLN
30.0000 mL | Freq: Once | ORAL | Status: AC
  Administered 2020-03-29: 30 mL via ORAL
  Filled 2020-03-29: qty 15

## 2020-03-29 MED ORDER — STERILE WATER FOR IRRIGATION IR SOLN
Status: DC | PRN
  Administered 2020-03-29: 1000 mL

## 2020-03-29 MED ORDER — SIMETHICONE 80 MG PO CHEW
80.0000 mg | CHEWABLE_TABLET | Freq: Three times a day (TID) | ORAL | Status: DC
  Administered 2020-03-29 – 2020-03-31 (×7): 80 mg via ORAL
  Filled 2020-03-29 (×6): qty 1

## 2020-03-29 MED ORDER — KETOROLAC TROMETHAMINE 30 MG/ML IJ SOLN
30.0000 mg | Freq: Four times a day (QID) | INTRAMUSCULAR | Status: DC | PRN
  Administered 2020-03-29: 30 mg via INTRAVENOUS

## 2020-03-29 MED ORDER — BUPIVACAINE IN DEXTROSE 0.75-8.25 % IT SOLN
INTRATHECAL | Status: DC | PRN
  Administered 2020-03-29: 1.7 mL via INTRATHECAL

## 2020-03-29 MED ORDER — ENOXAPARIN SODIUM 60 MG/0.6ML ~~LOC~~ SOLN
50.0000 mg | SUBCUTANEOUS | Status: DC
  Administered 2020-03-30 – 2020-03-31 (×2): 50 mg via SUBCUTANEOUS
  Filled 2020-03-29 (×2): qty 0.6

## 2020-03-29 MED ORDER — LACTATED RINGERS IV BOLUS
1000.0000 mL | Freq: Once | INTRAVENOUS | Status: AC
  Administered 2020-03-29: 1000 mL via INTRAVENOUS

## 2020-03-29 MED ORDER — PRENATAL MULTIVITAMIN CH
1.0000 | ORAL_TABLET | Freq: Every day | ORAL | Status: DC
  Administered 2020-03-30 – 2020-03-31 (×2): 1 via ORAL
  Filled 2020-03-29 (×2): qty 1

## 2020-03-29 MED ORDER — HYDROCODONE-ACETAMINOPHEN 5-325 MG PO TABS
1.0000 | ORAL_TABLET | ORAL | Status: DC | PRN
  Administered 2020-03-30: 1 via ORAL
  Administered 2020-03-31: 2 via ORAL
  Filled 2020-03-29: qty 1
  Filled 2020-03-29: qty 2

## 2020-03-29 MED ORDER — FAMOTIDINE IN NACL 20-0.9 MG/50ML-% IV SOLN
20.0000 mg | Freq: Once | INTRAVENOUS | Status: AC
  Administered 2020-03-29: 20 mg via INTRAVENOUS
  Filled 2020-03-29: qty 50

## 2020-03-29 MED ORDER — FENTANYL CITRATE (PF) 100 MCG/2ML IJ SOLN
INTRAMUSCULAR | Status: AC
  Filled 2020-03-29: qty 2

## 2020-03-29 MED ORDER — OXYTOCIN-SODIUM CHLORIDE 30-0.9 UT/500ML-% IV SOLN
2.5000 [IU]/h | INTRAVENOUS | Status: AC
  Administered 2020-03-29: 2.5 [IU]/h via INTRAVENOUS
  Filled 2020-03-29: qty 500

## 2020-03-29 MED ORDER — ONDANSETRON HCL 4 MG/2ML IJ SOLN
INTRAMUSCULAR | Status: AC
  Filled 2020-03-29: qty 2

## 2020-03-29 MED ORDER — MORPHINE SULFATE (PF) 0.5 MG/ML IJ SOLN
INTRAMUSCULAR | Status: DC | PRN
  Administered 2020-03-29: .15 mg via INTRATHECAL

## 2020-03-29 MED ORDER — METRONIDAZOLE 500 MG PO TABS
500.0000 mg | ORAL_TABLET | Freq: Two times a day (BID) | ORAL | Status: DC
  Administered 2020-03-29 – 2020-03-31 (×4): 500 mg via ORAL
  Filled 2020-03-29 (×6): qty 1

## 2020-03-29 MED ORDER — LEVONORGESTREL 19.5 MCG/DAY IU IUD
INTRAUTERINE_SYSTEM | INTRAUTERINE | Status: AC
  Filled 2020-03-29: qty 1

## 2020-03-29 MED ORDER — KETOROLAC TROMETHAMINE 30 MG/ML IJ SOLN
30.0000 mg | Freq: Four times a day (QID) | INTRAMUSCULAR | Status: AC
  Administered 2020-03-30 (×2): 30 mg via INTRAVENOUS
  Filled 2020-03-29 (×2): qty 1

## 2020-03-29 MED ORDER — DIPHENHYDRAMINE HCL 25 MG PO CAPS: 25.0000 mg | ORAL_CAPSULE | Freq: Four times a day (QID) | ORAL | Status: DC | PRN

## 2020-03-29 MED ORDER — PHENYLEPHRINE HCL-NACL 20-0.9 MG/250ML-% IV SOLN
INTRAVENOUS | Status: AC
  Filled 2020-03-29: qty 250

## 2020-03-29 MED ORDER — MORPHINE SULFATE (PF) 0.5 MG/ML IJ SOLN
INTRAMUSCULAR | Status: AC
  Filled 2020-03-29: qty 10

## 2020-03-29 MED ORDER — LACTATED RINGERS IV SOLN: INTRAVENOUS | Status: DC

## 2020-03-29 MED ORDER — FENTANYL CITRATE (PF) 100 MCG/2ML IJ SOLN: 25.0000 ug | INTRAMUSCULAR | Status: DC | PRN

## 2020-03-29 MED ORDER — SENNOSIDES-DOCUSATE SODIUM 8.6-50 MG PO TABS
2.0000 | ORAL_TABLET | ORAL | Status: DC
  Administered 2020-03-30: 2 via ORAL
  Filled 2020-03-29 (×2): qty 2

## 2020-03-29 MED ORDER — DEXAMETHASONE SODIUM PHOSPHATE 4 MG/ML IJ SOLN
INTRAMUSCULAR | Status: DC | PRN
  Administered 2020-03-29: 4 mg via INTRAVENOUS

## 2020-03-29 MED ORDER — FENTANYL CITRATE (PF) 100 MCG/2ML IJ SOLN
INTRAMUSCULAR | Status: DC | PRN
  Administered 2020-03-29: 15 ug via INTRATHECAL

## 2020-03-29 MED ORDER — NALOXONE HCL 4 MG/10ML IJ SOLN
1.0000 ug/kg/h | INTRAVENOUS | Status: DC | PRN
  Filled 2020-03-29: qty 5

## 2020-03-29 MED ORDER — NALOXONE HCL 0.4 MG/ML IJ SOLN: 0.4000 mg | INTRAMUSCULAR | Status: DC | PRN

## 2020-03-29 MED ORDER — DIBUCAINE (PERIANAL) 1 % EX OINT: 1.0000 "application " | TOPICAL_OINTMENT | CUTANEOUS | Status: DC | PRN

## 2020-03-29 MED ORDER — SODIUM CHLORIDE 0.9 % IR SOLN
Status: DC | PRN
  Administered 2020-03-29: 1000 mL

## 2020-03-29 MED ORDER — MEPERIDINE HCL 25 MG/ML IJ SOLN
INTRAMUSCULAR | Status: AC
  Filled 2020-03-29: qty 1

## 2020-03-29 MED ORDER — WITCH HAZEL-GLYCERIN EX PADS: 1.0000 "application " | MEDICATED_PAD | CUTANEOUS | Status: DC | PRN

## 2020-03-29 MED ORDER — SIMETHICONE 80 MG PO CHEW
80.0000 mg | CHEWABLE_TABLET | ORAL | Status: DC | PRN
  Administered 2020-03-30: 80 mg via ORAL
  Filled 2020-03-29: qty 1

## 2020-03-29 MED ORDER — LEVONORGESTREL 19.5 MCG/DAY IU IUD
INTRAUTERINE_SYSTEM | Freq: Once | INTRAUTERINE | Status: AC
  Administered 2020-03-29: 1 via INTRAUTERINE
  Filled 2020-03-29: qty 1

## 2020-03-29 MED ORDER — CEFAZOLIN SODIUM-DEXTROSE 2-4 GM/100ML-% IV SOLN
2.0000 g | INTRAVENOUS | Status: AC
  Administered 2020-03-29: 2 g via INTRAVENOUS
  Filled 2020-03-29: qty 100

## 2020-03-29 SURGICAL SUPPLY — 38 items
CHLORAPREP W/TINT 26ML (MISCELLANEOUS) ×2 IMPLANT
CLAMP CORD UMBIL (MISCELLANEOUS) IMPLANT
CLOTH BEACON ORANGE TIMEOUT ST (SAFETY) ×2 IMPLANT
DERMABOND ADHESIVE PROPEN (GAUZE/BANDAGES/DRESSINGS) ×2
DERMABOND ADVANCED .7 DNX6 (GAUZE/BANDAGES/DRESSINGS) ×2 IMPLANT
DRSG OPSITE POSTOP 4X10 (GAUZE/BANDAGES/DRESSINGS) ×2 IMPLANT
ELECT REM PT RETURN 9FT ADLT (ELECTROSURGICAL) ×2
ELECTRODE REM PT RTRN 9FT ADLT (ELECTROSURGICAL) ×1 IMPLANT
EXTRACTOR VACUUM BELL STYLE (SUCTIONS) IMPLANT
GAUZE SPONGE 4X4 12PLY STRL LF (GAUZE/BANDAGES/DRESSINGS) ×4 IMPLANT
GLOVE BIOGEL PI IND STRL 7.0 (GLOVE) ×2 IMPLANT
GLOVE BIOGEL PI INDICATOR 7.0 (GLOVE) ×2
GLOVE ECLIPSE 7.0 STRL STRAW (GLOVE) ×2 IMPLANT
GOWN STRL REUS W/TWL LRG LVL3 (GOWN DISPOSABLE) ×4 IMPLANT
KIT ABG SYR 3ML LUER SLIP (SYRINGE) ×2 IMPLANT
NEEDLE HYPO 25X5/8 SAFETYGLIDE (NEEDLE) ×2 IMPLANT
NS IRRIG 1000ML POUR BTL (IV SOLUTION) ×2 IMPLANT
PACK C SECTION WH (CUSTOM PROCEDURE TRAY) ×2 IMPLANT
PAD ABD 8X10 STRL (GAUZE/BANDAGES/DRESSINGS) ×2 IMPLANT
PAD OB MATERNITY 4.3X12.25 (PERSONAL CARE ITEMS) ×2 IMPLANT
PENCIL SMOKE EVAC W/HOLSTER (ELECTROSURGICAL) ×2 IMPLANT
RETAINER VISCERAL (MISCELLANEOUS) ×2 IMPLANT
RTRCTR C-SECT PINK 25CM LRG (MISCELLANEOUS) ×2 IMPLANT
SPONGE GAUZE 4X4 12PLY STER LF (GAUZE/BANDAGES/DRESSINGS) ×4 IMPLANT
SUT MNCRL 0 VIOLET CTX 36 (SUTURE) ×3 IMPLANT
SUT MONOCRYL 0 CTX 36 (SUTURE) ×3
SUT PLAIN 0 NONE (SUTURE) IMPLANT
SUT PLAIN 2 0 (SUTURE)
SUT PLAIN 2 0 XLH (SUTURE) IMPLANT
SUT PLAIN ABS 2-0 CT1 27XMFL (SUTURE) IMPLANT
SUT VIC AB 0 CTX 36 (SUTURE) ×1
SUT VIC AB 0 CTX36XBRD ANBCTRL (SUTURE) ×1 IMPLANT
SUT VIC AB 2-0 CT1 27 (SUTURE) ×1
SUT VIC AB 2-0 CT1 TAPERPNT 27 (SUTURE) ×1 IMPLANT
SUT VIC AB 4-0 KS 27 (SUTURE) ×2 IMPLANT
TOWEL OR 17X24 6PK STRL BLUE (TOWEL DISPOSABLE) ×2 IMPLANT
TRAY FOLEY W/BAG SLVR 14FR LF (SET/KITS/TRAYS/PACK) IMPLANT
WATER STERILE IRR 1000ML POUR (IV SOLUTION) ×2 IMPLANT

## 2020-03-29 NOTE — Anesthesia Preprocedure Evaluation (Signed)
Anesthesia Evaluation  Patient identified by MRN, date of birth, ID band Patient awake    Reviewed: Allergy & Precautions, NPO status , Patient's Chart, lab work & pertinent test results  Airway Mallampati: III  TM Distance: >3 FB Neck ROM: Full    Dental no notable dental hx. (+) Teeth Intact   Pulmonary neg pulmonary ROS,    Pulmonary exam normal breath sounds clear to auscultation       Cardiovascular negative cardio ROS Normal cardiovascular exam Rhythm:Regular Rate:Normal     Neuro/Psych negative neurological ROS  negative psych ROS   GI/Hepatic Neg liver ROS, GERD  ,  Endo/Other  Morbid obesity  Renal/GU negative Renal ROS  negative genitourinary   Musculoskeletal negative musculoskeletal ROS (+)   Abdominal (+) + obese,   Peds  Hematology  (+) anemia ,   Anesthesia Other Findings   Reproductive/Obstetrics (+) Pregnancy No PNC 41 weeks Breech presentation-in labor                             Anesthesia Physical Anesthesia Plan  ASA: III and emergent  Anesthesia Plan: Spinal   Post-op Pain Management:    Induction:   PONV Risk Score and Plan: 4 or greater and Treatment may vary due to age or medical condition  Airway Management Planned: Natural Airway  Additional Equipment:   Intra-op Plan:   Post-operative Plan:   Informed Consent: I have reviewed the patients History and Physical, chart, labs and discussed the procedure including the risks, benefits and alternatives for the proposed anesthesia with the patient or authorized representative who has indicated his/her understanding and acceptance.     Dental advisory given  Plan Discussed with: Anesthesiologist and CRNA  Anesthesia Plan Comments:         Anesthesia Quick Evaluation

## 2020-03-29 NOTE — Transfer of Care (Signed)
Immediate Anesthesia Transfer of Care Note  Patient: Sonia James  Procedure(s) Performed: CESAREAN SECTION (N/A Abdomen)  Patient Location: PACU  Anesthesia Type:Spinal  Level of Consciousness: awake, alert  and patient cooperative  Airway & Oxygen Therapy: Patient Spontanous Breathing  Post-op Assessment: Report given to RN and Post -op Vital signs reviewed and stable  Post vital signs: Reviewed and stable  Last Vitals:  Vitals Value Taken Time  BP    Temp    Pulse    Resp    SpO2      Last Pain:  Vitals:   03/29/20 1103  TempSrc: Oral  PainSc:          Complications: No complications documented.

## 2020-03-29 NOTE — MAU Provider Note (Signed)
History     CSN: [id number]  Arrival date and time: 03/29/20 1046   Event Date/Time   First Provider Initiated Contact with Patient 03/29/20 1150      Chief Complaint  Patient presents with  . Pelvic Pain   HPI This is a 25yo G1P0 at [redacted] weeks gestation with no prenatal care. She presents with contractions. Denies decreased fetal movement, leaking fluid.  OB History    Gravida  1   Para      Term      Preterm      AB      Living        SAB      IAB      Ectopic      Multiple      Live Births              Past Medical History:  Diagnosis Date  . Anemia     History reviewed. No pertinent surgical history.  No family history on file.  Social History   Tobacco Use  . Smoking status: Never Smoker  . Smokeless tobacco: Never Used  Substance Use Topics  . Alcohol use: Yes  . Drug use: No    Allergies: No Known Allergies  Medications Prior to Admission  Medication Sig Dispense Refill Last Dose  . Prenatal Vit-Fe Fumarate-FA (PRENATAL MULTIVITAMIN) TABS tablet Take 1 tablet by mouth daily at 12 noon.   03/28/2020 at Unknown time  . cyclobenzaprine (FLEXERIL) 10 MG tablet Take 1 tablet (10 mg total) by mouth 2 (two) times daily as needed for muscle spasms. 20 tablet 0   . naproxen (NAPROSYN) 500 MG tablet Take 1 tablet (500 mg total) by mouth 2 (two) times daily. 30 tablet 0   . ondansetron (ZOFRAN) 4 MG tablet Take 1 tablet (4 mg total) by mouth every 6 (six) hours. (Patient not taking: Reported on 01/24/2016) 12 tablet 0   . oseltamivir (TAMIFLU) 75 MG capsule Take 1 capsule (75 mg total) by mouth 2 (two) times daily. X 5 days (Patient not taking: Reported on 01/24/2016) 10 capsule 0     Review of Systems Physical Exam   Blood pressure 124/75, pulse 99, temperature 98.7 F (37.1 C), temperature source Oral, resp. rate 16, height 5\' 5"  (1.651 m), weight 107.2 kg, SpO2 100 %.  Physical Exam Vitals reviewed. Exam conducted with a chaperone present.   Constitutional:      Appearance: Normal appearance.  Cardiovascular:     Pulses: Normal pulses.  Pulmonary:     Effort: Pulmonary effort is normal.     Breath sounds: Normal breath sounds.  Abdominal:     Comments: Gravid to term  Neurological:     Mental Status: She is alert.   Dilation: 3.5 Effacement (%): 80 Station: -2 Presentation: Vertex Exam by:: Dr. Nehemiah Settle   MAU Course  Procedures Category 1 tracing.  MDM  Assessment and Plan  1. Insufficient prenatal care - [place] MFM OB DETAIL +14 WK; Standing - [place] MFM OB DETAIL +14 WK - [place] MFM FETAL BPP WO NON STRESS; Standing - [place] MFM FETAL BPP WO NON STRESS  2. [redacted] weeks gestation of pregnancy  3. Spontaneous breech delivery, single or unspecified fetus  Breech position. Due to labor symptoms, unlikely to turn baby.  NPO since 1am. PNL drawn.  Will prepare for OR Plans on bottle feeding. Unsure of contraception Desires circumcision for the baby if female.  Truett Mainland 03/29/2020, 12:57 PM

## 2020-03-29 NOTE — H&P (Signed)
LABOR AND DELIVERY ADMISSION HISTORY AND PHYSICAL NOTE  Psalm Arman is a 25 y.o. female G65P0 with IUP at [redacted]w[redacted]d by 7wk [place] presenting for cesarean section.   She reports positive fetal movement. She denies leakage of fluid or vaginal bleeding. She is having some contractions.   She plans on bottle feeding. She requests hormonal IUD for birth control.  Prenatal History/Complications: No prenatal care this pregnancy  Pregnancy complications:  -   Past Medical History: Past Medical History:  Diagnosis Date  . Anemia     Past Surgical History: History reviewed. No pertinent surgical history.  Obstetrical History: OB History    Gravida  1   Para      Term      Preterm      AB      Living        SAB      IAB      Ectopic      Multiple      Live Births              Social History: Social History   Socioeconomic History  . Marital status: Single    Spouse name: Not on file  . Number of children: Not on file  . Years of education: Not on file  . Highest education level: Not on file  Occupational History  . Not on file  Tobacco Use  . Smoking status: Never Smoker  . Smokeless tobacco: Never Used  Substance and Sexual Activity  . Alcohol use: Yes  . Drug use: No  . Sexual activity: Yes    Birth control/protection: None  Other Topics Concern  . Not on file  Social History Narrative  . Not on file   Social Determinants of Health   Financial Resource Strain: Not on file  Food Insecurity: Not on file  Transportation Needs: Not on file  Physical Activity: Not on file  Stress: Not on file  Social Connections: Not on file    Family History: History reviewed. No pertinent family history.  Allergies: No Known Allergies  Medications Prior to Admission  Medication Sig Dispense Refill Last Dose  . Prenatal Vit-Fe Fumarate-FA (PRENATAL MULTIVITAMIN) TABS tablet Take 1 tablet by mouth daily at 12 noon.   03/28/2020 at Unknown time  . cyclobenzaprine  (FLEXERIL) 10 MG tablet Take 1 tablet (10 mg total) by mouth 2 (two) times daily as needed for muscle spasms. 20 tablet 0   . naproxen (NAPROSYN) 500 MG tablet Take 1 tablet (500 mg total) by mouth 2 (two) times daily. 30 tablet 0   . ondansetron (ZOFRAN) 4 MG tablet Take 1 tablet (4 mg total) by mouth every 6 (six) hours. (Patient not taking: Reported on 01/24/2016) 12 tablet 0   . oseltamivir (TAMIFLU) 75 MG capsule Take 1 capsule (75 mg total) by mouth 2 (two) times daily. X 5 days (Patient not taking: Reported on 01/24/2016) 10 capsule 0      Review of Systems  All systems reviewed and negative except as stated in HPI  Physical Exam Blood pressure 124/75, pulse 99, temperature 98.7 F (37.1 C), temperature source Oral, resp. rate 16, height 5\' 5"  (1.651 m), weight 107.2 kg, SpO2 100 %. General appearance: alert, oriented, NAD Lungs: normal respiratory effort Heart: regular rate Abdomen: soft, non-tender; gravid Extremities: No calf swelling or tenderness FHR: baseline 135, moderate variability, +accels, no decels, contractions irregular q5-8 min  Prenatal labs: ABO, Rh: --/--/O POS (01/10 1142) Antibody: NEG (01/10  1142) Rubella:  pending RPR:   pending HBsAg: NON REACTIVE (01/10 1146)  HIV: Non Reactive (01/10 1146)  GC/Chlamydia: pending  GBS:   pending 2-hr GTT: not done, A1c pending Genetic screening:  Not done Anatomy US: not done  Prenatal Transfer Tool  Maternal Diabetes: unknown, but finger stick not elevated Genetic Screening: not done Maternal Ultrasounds/Referrals: Other:not done Fetal Ultrasounds or other Referrals:  Other: not done Maternal Substance Abuse:  unknown Significant Maternal Medications:  None Significant Maternal Lab Results: None  Results for orders placed or performed during the hospital encounter of 03/29/20 (from the past 24 hour(s))  Type and screen [place]   Collection Time: 03/29/20 11:42 AM  Result Value Ref Range    ABO/RH(D) O POS    Antibody Screen NEG    Sample Expiration      04/01/2020,2359 Performed at [facility] [address] [place], [place] [address]   Hepatitis B surface antigen   Collection Time: 03/29/20 11:46 AM  Result Value Ref Range   Hepatitis B Surface Ag NON REACTIVE NON REACTIVE  CBC   Collection Time: 03/29/20 11:46 AM  Result Value Ref Range   WBC 10.0 4.0 - 10.5 K/uL   RBC 4.18 3.87 - 5.11 MIL/uL   Hemoglobin 10.4 (L) 12.0 - 15.0 g/dL   HCT 33.9 (L) 36.0 - 46.0 %   MCV 81.1 80.0 - 100.0 fL   MCH 24.9 (L) 26.0 - 34.0 pg   MCHC 30.7 30.0 - 36.0 g/dL   RDW 16.5 (H) 11.5 - 15.5 %   Platelets 454 (H) 150 - 400 K/uL   nRBC 0.0 0.0 - 0.2 %  Differential   Collection Time: 03/29/20 11:46 AM  Result Value Ref Range   Neutrophils Relative % 74 %   Neutro Abs 7.5 1.7 - 7.7 K/uL   Lymphocytes Relative 18 %   Lymphs Abs 1.8 0.7 - 4.0 K/uL   Monocytes Relative 7 %   Monocytes Absolute 0.7 0.1 - 1.0 K/uL   Eosinophils Relative 0 %   Eosinophils Absolute 0.0 0.0 - 0.5 K/uL   Basophils Relative 0 %   Basophils Absolute 0.0 0.0 - 0.1 K/uL   Immature Granulocytes 1 %   Abs Immature Granulocytes 0.06 0.00 - 0.07 K/uL  HIV Antibody (routine testing w rflx)   Collection Time: 03/29/20 11:46 AM  Result Value Ref Range   HIV Screen 4th Generation wRfx Non Reactive Non Reactive  Urinalysis, Routine w reflex microscopic Urine, Clean Catch   Collection Time: 03/29/20 12:30 PM  Result Value Ref Range   Color, Urine YELLOW YELLOW   APPearance HAZY (A) CLEAR   Specific Gravity, Urine 1.018 1.005 - 1.030   pH 6.0 5.0 - 8.0   Glucose, UA NEGATIVE NEGATIVE mg/dL   Hgb urine dipstick NEGATIVE NEGATIVE   Bilirubin Urine NEGATIVE NEGATIVE   Ketones, ur NEGATIVE NEGATIVE mg/dL   Protein, ur NEGATIVE NEGATIVE mg/dL   Nitrite NEGATIVE NEGATIVE   Leukocytes,Ua LARGE (A) NEGATIVE   RBC / HPF 0-5 0 - 5 RBC/hpf   WBC, UA 11-20 0 - 5 WBC/hpf   Bacteria, UA RARE (A) NONE SEEN    Squamous Epithelial / LPF 0-5 0 - 5   Mucus PRESENT    Trichomonas, UA PRESENT (A) NONE SEEN  Resp Panel by RT-PCR (Flu A&B, Covid) Nasopharyngeal Swab   Collection Time: 03/29/20 12:47 PM   Specimen: Nasopharyngeal Swab; Nasopharyngeal(NP) swabs in vial transport medium  Result Value  Ref Range   SARS Coronavirus 2 by RT PCR NEGATIVE NEGATIVE   Influenza A by PCR NEGATIVE NEGATIVE   Influenza B by PCR NEGATIVE NEGATIVE  Glucose, capillary   Collection Time: 03/29/20  2:58 PM  Result Value Ref Range   Glucose-Capillary 64 (L) 70 - 99 mg/dL    There are no problems to display for this patient.   Assessment: Cleora Karnik is a 25 y.o. G1P0 at [redacted]w[redacted]d here for scheduled CS.  #Primary CS for breech presentation: On bedside US patient found to be in complete breech presentation with back down.   The risks of cesarean section discussed with the patient included but were not limited to: bleeding which may require transfusion or reoperation; infection which may require antibiotics; injury to bowel, bladder, ureters or other surrounding organs; injury to the fetus; need for additional procedures including hysterectomy in the event of a life-threatening hemorrhage; placental abnormalities with subsequent pregnancies, incisional problems, thromboembolic phenomenon and other postoperative/anesthesia complications. The patient concurred with the proposed plan, giving informed written consent for the procedure. Patient has been NPO since last night she will remain NPO for procedure. Anesthesia and OR aware. Preoperative prophylactic antibiotics and SCDs ordered on call to the OR. To OR when ready.   #Anesthesia: Spinal #FWB: Cat I #GBS/ID: unknown, pending #COVID: swab neg on 03/29/2020 #MOF: bottle #MOC: hormonal IUD #Circ: TBD  #No PNC: SW consult post partum, UDS pending, prenatal labs in process  Venora Maples 03/29/2020, 3:05 PM

## 2020-03-29 NOTE — MAU Note (Signed)
Sonia James is a 25 y.o. at [redacted]w[redacted]d here in MAU reporting: states she was at her PCP this AM and they asked her about getting [facility] and when she indicated that she has not been receiving care they referred her to [place] who could not see her due to her dating. [facility] referred her to [facility] and they told her to come here. Pt reports she is having some pelvic pain but denies contractions, bleeding, or LOF. Pelvic pain has been ongoing since about 30 weeks. + FM  Pt reports she has not had PNC but did [facility] week [facility] and they said her due date was 03/22/20  Onset of complaint: ongoing  Pain score: 9/10  Vitals:   03/29/20 1103  BP: 136/74  Pulse: 93  Resp: 16  Temp: 98.7 F (37.1 C)  SpO2: 100%     FHT: +FM  Lab orders placed from triage: UA

## 2020-03-29 NOTE — MAU Note (Signed)
Report given to OB anesthesia and OB OR charge nurse.

## 2020-03-29 NOTE — Op Note (Signed)
Operative Note   Patient: [name]  Date of Procedure: 03/29/2020  Procedure: Primary Low Transverse Cesarean   Indications: malpresentation: complete breech  Pre-operative Diagnosis: breech, primary cesarean section.   Post-operative Diagnosis: Same and IUD insertion: Liletta  TOLAC Candidate: Yes   Surgeon: Surgeon(s) and Role:    * Vermon Grays, Annice Needy, MD - Primary  Assistants: none  An experienced assistant was required given the standard of surgical care given the complexity of the case.  This assistant was needed for exposure, dissection, suctioning, retraction, instrument exchange, assisting with delivery with administration of fundal pressure, and for overall help during the procedure.   Anesthesia: spinal  Anesthesiologist: Dr. Royce Macadamia   Antibiotics: Cefazolin   Estimated Blood Loss: 427 ml   Total IV Fluids: 1700 ml  Urine Output: 100 cc OF clear urine  Specimens: placenta to pathology   Complications: no complications   Indications: Sonia James is a 25 y.o. G1P1001 with an IUP [redacted]w[redacted]d presenting for unscheduled, urgent secondary to the indications listed above. Clinical course notable for: presented to MAU after being told by PCP she needed to seek prenatal care. Found to be in breech presentation.  The risks of cesarean section discussed with the patient included but were not limited to: bleeding which may require transfusion or reoperation; infection which may require antibiotics; injury to bowel, bladder, ureters or other surrounding organs; injury to the fetus; need for additional procedures including hysterectomy in the event of a life-threatening hemorrhage; placental abnormalities with subsequent pregnancies, incisional problems, thromboembolic phenomenon and other postoperative/anesthesia complications. The patient concurred with the proposed plan, giving informed written consent for the procedure. Patient has been NPO since last night she will remain NPO  for procedure. Anesthesia and OR aware. Preoperative prophylactic antibiotics and SCDs ordered on call to the OR.   Findings: Viable infant in complete breech presentation, no nuchal cord present. Apgars 9 , 9 , . Weight 3345 g . Clear amniotic fluid. Normal placenta, three vessel cord. Normal uterus with R fundal fibroid palpated, Normal bilateral fallopian tubes, Normal bilateral ovaries.  Procedure Details: A Time Out was held and the above information confirmed. The patient received intravenous antibiotics and had sequential compression devices applied to her lower extremities preoperatively. The patient was taken back to the operative suite where spinal anesthesia was administered. After induction of anesthesia, the patient was draped and prepped in the usual sterile manner and placed in a dorsal supine position with a leftward tilt. A low transverse was made with scalpel and carried down through the subcutaneous tissue to the fascia. Fascial incision was made and extended transversely. The fascia was separated from the underlying rectus tissue superiorly and inferiorly. The rectus muscles were separated in the midline bluntly and the peritoneum was entered bluntly. An Alexis retractor was placed to aid in visualization of the uterus. A bladder flap was not developed. A low transverse was made. The infant was successfully delivered from complete breech presentation, the umbilical cord was clamped after 1 minute. Cord ph was not sent, and cord blood was obtained for evaluation. The placenta was removed Intact and appeared normal.   A Liletta IUD was then removed from it's packaging in a sterile manner and the strings were trimmed to approximately 10 cm. The IUD was placed manually at the uterine fundus and the strings were passed through the cervical os with a Kelly clamp.   The uterine incision was closed with running locked sutures of 0-Monocryl, and then a second imbricating layer  was also placed with  0-Monocryl. Overall, excellent hemostasis was noted. The abdomen and the pelvis were cleared of all clot and debris and the Ubaldo Glassing was removed. Hemostasis was confirmed on all surfaces.  The peritoneum was reapproximated using 2-0 vicryl . The fascia was then closed using 0 Vicryl in a running fashion. The subcutaneous layer was reapproximated with plain gut and the skin was closed with a 4-0 vicryl subcuticular stitch. The patient tolerated the procedure well. Sponge, lap, instrument and needle counts were correct x 2. She was taken to the recovery room in stable condition.  Disposition: PACU - hemodynamically stable.    Signed: Clarnce Flock, MD, MPH [facility])

## 2020-03-29 NOTE — Discharge Summary (Addendum)
Postpartum Discharge Summary   Patient Name: Sonia James DOB: 03/26/1995 MRN: [id number]  Date of admission: 03/29/2020 Delivery date:03/29/2020  Delivering provider: Clarnce Flock  Date of discharge: 03/31/2020  Admitting diagnosis: Breech presentation of fetus [O32.1XX0] Intrauterine pregnancy: [redacted]w[redacted]d    Secondary diagnosis:  Active Problems:   Cesarean delivery delivered   Trichomoniasis   No prenatal care in current pregnancy   IUD (intrauterine device) in place   Breech presentation of fetus  Additional problems: Chlamydia positive, treated with azithro   Discharge diagnosis: Term Pregnancy Delivered                                              Post partum procedures: Post placental Liletta IUD placed Augmentation: N/A Complications: None  Hospital course: Onset of Labor With Unplanned C/S   25y.o. yo G1P1001 at 440w0das admitted in Latent Labor on 03/29/2020. Patient had a labor course significant for: presented to MAU after no prenatal care at suggestion of her PCP. The patient went for cesarean section due to  breech presentation . Delivery details as follows: Membrane Rupture Time/Date: 3:47 PM ,03/29/2020   Delivery Method:C-Section, Low Transverse  Details of operation can be found in separate operative note. Patient had an uncomplicated postpartum course.  She is ambulating,tolerating a regular diet, passing flatus, and urinating well.  Patient is discharged home in stable condition 03/31/20.  Newborn Data: Birth date:03/29/2020  Birth time:3:48 PM  Gender:Female  Living status:Living  Apgars:9 ,9  Weight:3345 g   Magnesium Sulfate received: No BMZ received: No Rhophylac:N/A MMR:N/A T-DaP:Given postpartum Flu: Yes Transfusion:No  Physical exam  Vitals:   03/30/20 1200 03/30/20 1525 03/30/20 2237 03/31/20 0512  BP: (!) 105/45 119/63 (!) 122/54 127/62  Pulse: 88 90 91 93  Resp: '20 19 18 17  ' Temp:  98.2 F (36.8 C) 98.3 F (36.8 C) 98 F (36.7 C)   TempSrc: Oral Oral Oral Oral  SpO2: 100% 100% 99% 100%  Weight:      Height:       General: alert, cooperative and no distress Lochia: appropriate Uterine Fundus: firm Incision: Healing well with no significant drainage, No significant erythema, Dressing is clean, dry, and intact DVT Evaluation: No evidence of DVT seen on physical exam. Labs: Lab Results  Component Value Date   WBC 13.1 (H) 03/30/2020   HGB 8.2 (L) 03/30/2020   HCT 25.4 (L) 03/30/2020   MCV 80.1 03/30/2020   PLT 345 03/30/2020   CMP Latest Ref Rng & Units 01/24/2016  Glucose 65 - 99 mg/dL 94  BUN 6 - 20 mg/dL 4(L)  Creatinine 0.44 - 1.00 mg/dL 0.70  Sodium 135 - 145 mmol/L 141  Potassium 3.5 - 5.1 mmol/L 3.9  Chloride 101 - 111 mmol/L 102   Edinburgh Score: Edinburgh Postnatal Depression Scale Screening Tool 03/30/2020  I have been able to laugh and see the funny side of things. 0  I have looked forward with enjoyment to things. 0  I have blamed myself unnecessarily when things went wrong. 0  I have been anxious or worried for no good reason. 0  I have felt scared or panicky for no good reason. 0  Things have been getting on top of me. 0  I have been so unhappy that I have had difficulty sleeping. 0  I have felt sad or miserable. 0  I have been so unhappy that I have been crying. 0  The thought of harming myself has occurred to me. 0  Edinburgh Postnatal Depression Scale Total 0     After visit meds:  Allergies as of 03/31/2020   No Known Allergies      Medication List     STOP taking these medications    cyclobenzaprine 10 MG tablet Commonly known as: FLEXERIL   naproxen 500 MG tablet Commonly known as: NAPROSYN   ondansetron 4 MG tablet Commonly known as: ZOFRAN   oseltamivir 75 MG capsule Commonly known as: TAMIFLU       TAKE these medications    acetaminophen 325 MG tablet Commonly known as: Tylenol Take 2 tablets (650 mg total) by mouth every 4 (four) hours as needed for  mild pain, moderate pain, fever or headache (pain >4, fever > 101.5*F).   HYDROcodone-acetaminophen 5-325 MG tablet Commonly known as: NORCO/VICODIN Take 1-2 tablets by mouth every 4 (four) hours as needed for moderate pain.   ibuprofen 800 MG tablet Commonly known as: ADVIL Take 1 tablet (800 mg total) by mouth every 8 (eight) hours.   metroNIDAZOLE 500 MG tablet Commonly known as: FLAGYL Take 1 tablet (500 mg total) by mouth every 12 (twelve) hours for 4 days.   prenatal multivitamin Tabs tablet Take 1 tablet by mouth daily at 12 noon.               Discharge Care Instructions  (From admission, onward)           Start     Ordered   03/31/20 0000  If the dressing is still on your incision site when you go home, remove it on the third day after your surgery date. Remove dressing if it begins to fall off, or if it is dirty or damaged before the third day.       Comments: If any spreading redness, warmth, or pustulant discharge, please call your doctor   03/31/20 1247             Discharge home in stable condition Infant Feeding: Bottle Infant Disposition:home with mother Discharge instruction: per After Visit Summary and Postpartum booklet. Activity: Advance as tolerated. Pelvic rest for 6 weeks.  Diet: routine diet Future Appointments:No future appointments. Follow up Visit:    Please schedule this patient for a In person postpartum visit in 4 weeks with the following provider: Any provider. Additional Postpartum F/U:Incision check 1 week and pap smear, IUD string check , treated with flagyl for trichomonas on admission High risk pregnancy complicated by:  no prenatal care Delivery mode:  C-Section, Low Transverse  Anticipated Birth Control:  PP IUD placed Chlamydia positive before discharge, treated with azithromycin 1,040m on 03/30/20  03/31/2020 CGladys Damme MD  I spoke with and examined patient and agree with resident/PA-S/MS/SNM's note and plan  of care.  KRoma Schanz CNM, [facility]1/18/2022 5:18 PM

## 2020-03-29 NOTE — Anesthesia Procedure Notes (Signed)
Spinal  Patient location during procedure: OR Start time: 03/29/2020 3:20 PM End time: 03/29/2020 3:24 PM Staffing Performed: anesthesiologist  Anesthesiologist: Josephine Igo, MD Preanesthetic Checklist Completed: patient identified, IV checked, site marked, risks and benefits discussed, surgical consent, monitors and equipment checked, pre-op evaluation and timeout performed Spinal Block Patient position: sitting Prep: DuraPrep and site prepped and draped Patient monitoring: heart rate, cardiac monitor, continuous pulse ox and blood pressure Approach: midline Location: L3-4 Injection technique: single-shot Needle Needle type: Pencan  Needle gauge: 24 G Needle length: 9 cm Needle insertion depth: 7 cm Assessment Sensory level: T4 Additional Notes Patient tolerated procedure well. Adequate sensory level.

## 2020-03-30 ENCOUNTER — Encounter (HOSPITAL_COMMUNITY): Payer: Self-pay | Admitting: Family Medicine

## 2020-03-30 DIAGNOSIS — Z975 Presence of (intrauterine) contraceptive device: Secondary | ICD-10-CM

## 2020-03-30 DIAGNOSIS — O9833 Other infections with a predominantly sexual mode of transmission complicating the puerperium: Secondary | ICD-10-CM

## 2020-03-30 DIAGNOSIS — Z98891 History of uterine scar from previous surgery: Secondary | ICD-10-CM

## 2020-03-30 DIAGNOSIS — A599 Trichomoniasis, unspecified: Secondary | ICD-10-CM

## 2020-03-30 LAB — CBC
HCT: 25.4 % — ABNORMAL LOW (ref 36.0–46.0)
Hemoglobin: 8.2 g/dL — ABNORMAL LOW (ref 12.0–15.0)
MCH: 25.9 pg — ABNORMAL LOW (ref 26.0–34.0)
MCHC: 32.3 g/dL (ref 30.0–36.0)
MCV: 80.1 fL (ref 80.0–100.0)
Platelets: 345 10*3/uL (ref 150–400)
RBC: 3.17 MIL/uL — ABNORMAL LOW (ref 3.87–5.11)
RDW: 16.3 % — ABNORMAL HIGH (ref 11.5–15.5)
WBC: 13.1 10*3/uL — ABNORMAL HIGH (ref 4.0–10.5)
nRBC: 0 % (ref 0.0–0.2)

## 2020-03-30 LAB — GC/CHLAMYDIA PROBE AMP (~~LOC~~) NOT AT ARMC
Chlamydia: POSITIVE — AB
Comment: NEGATIVE
Comment: NORMAL
Neisseria Gonorrhea: NEGATIVE

## 2020-03-30 LAB — CULTURE, BETA STREP (GROUP B ONLY)

## 2020-03-30 LAB — RPR: RPR Ser Ql: NONREACTIVE

## 2020-03-30 LAB — RUBELLA SCREEN: Rubella: 1.63 index (ref >0.99)

## 2020-03-30 MED ORDER — FERROUS SULFATE 325 (65 FE) MG PO TABS
325.0000 mg | ORAL_TABLET | ORAL | Status: DC
  Administered 2020-03-30: 325 mg via ORAL
  Filled 2020-03-30: qty 1

## 2020-03-30 MED ORDER — SODIUM CHLORIDE 0.9 % IV SOLN
500.0000 mg | Freq: Once | INTRAVENOUS | Status: AC
  Administered 2020-03-30: 500 mg via INTRAVENOUS
  Filled 2020-03-30: qty 25

## 2020-03-30 MED ORDER — INFLUENZA VAC SPLIT QUAD 0.5 ML IM SUSY
0.5000 mL | PREFILLED_SYRINGE | INTRAMUSCULAR | Status: AC
  Administered 2020-03-31: 0.5 mL via INTRAMUSCULAR
  Filled 2020-03-30: qty 0.5

## 2020-03-30 MED ORDER — AZITHROMYCIN 500 MG PO TABS
1000.0000 mg | ORAL_TABLET | Freq: Once | ORAL | Status: AC
  Administered 2020-03-30: 1000 mg via ORAL
  Filled 2020-03-30: qty 2

## 2020-03-30 NOTE — Clinical Social Work Maternal (Signed)
CLINICAL SOCIAL WORK MATERNAL/CHILD NOTE  Patient Details  Name: Sonia James MRN: [id number] Date of Birth: 03/14/1996  Date:  03/30/2020  Clinical Social Worker Initiating Note:  Jeanette Caprice Hong Timm LCSW Date/Time: Initiated:  03/30/20/0900     Child's Name:  Sonia James   Biological Parents:  Mother,Father (Sonia James, Programmer, multimedia)   Need for Interpreter:  None   Reason for Referral:  Late or No Prenatal Care    Address:  [address] [place] [address]    Phone number:  [phone] (home)     Additional phone number: none   Household Members/Support Persons (HM/SP):   Household Member/Support Person 1   HM/SP Name Relationship DOB or Age  HM/SP -1  Sonia James  MOB  1995/07/07  HM/SP -2 Sonia James FOB 25 years old  HM/SP -3        HM/SP -4        HM/SP -5        HM/SP -6        HM/SP -7        HM/SP -8          Natural Supports (not living in the home):  Parent   Professional Supports: None   Employment: Full-time   Type of Work: works at [employer] from home.   Education:  Some College   Homebound arranged:  n/a  Museum/gallery curator Resources:  Medicaid   Other Resources:  [place]  (plans to apply for  [employer].)   Cultural/Religious Considerations Which May Impact Care:  none reported.   Strengths:  Ability to meet basic needs ,Compliance with medical plan ,Home prepared for child    Psychotropic Medications:      None reported to this CSW.    Pediatrician:     not chosen yet.   Pediatrician List:   [facility]      Pediatrician Fax Number:    Risk Factors/Current Problems:  None   Cognitive State:  Insightful ,Able to Concentrate ,Alert    Mood/Affect:  Relaxed ,Comfortable ,Calm ,Interested ,Happy    CSW Assessment: CSW consulted due to [facility] receiving no PNC during this pregnancy. CSW went to speak with  MOB at bedside to address further needs.   CSW congratulated MOB on the birth of infant. CSW then advised MOB of CSW's role and the reason for CSW coming to speak with her. MOB expressed that she didn't get care while pregnant due to "he was unsure about the baby". CSW was advised that per MOB, FOB was not sure about MOB keeping baby. MOB expressed that she has the desire to keep infant while FOB struggled with this. MOB expressed that for this reason she delayed care because she wasn't sure of what she should do. MOB reported to CSW that she is excited now that infant is here and that FOB is excited as well. MOB expressed understanding and advised MOB of the hospital drug screen policy. MOB was advised that infants UDS came back negative therefore CSW would need to monitor infants CDS. CSW advised MOB That if CDS is positive then CSW would need to make a CPS report. MOB expressed that she understood and expressed that she had no substance use while pregnant and denies taking any medications. MOB expressed that she has no previous or current CPS hx.  CSW inquired from [facility] on her mental health hx in which MOB expressed that she none. MOB expressed never being on any medications and denies therapy resources at this time. CSW was advised that MOB is not feeling SI, HI nor is MOB involved in DV. MOB expressed that she is from home with FOB but has support from her mom as well . MOB indicated that MOB has all needed items to care for infant with [facility] appointment scheduled for 04/08/20 at 2:45pm. MOB expressed that she has a carseat which is new and infant will sleep in basinet once arrived home.   CSW took time to provide MOB with PPD and SIDS education. MOB was given PPD Checklist in order to keep track of feeling as they may reate to PPD. MOB thanked CSW and expressed that since giving birth she has felt good.   CSW will continue to monitor CDS and make CPS report if warranted. No barrier's to discharge.   CSW  Plan/Description:  No Further Intervention Required/No Barriers to Discharge,Sudden Infant Death Syndrome (SIDS) Education,Perinatal Mood and Anxiety Disorder (PMADs) Education,CSW Will Continue to Monitor Umbilical Cord Tissue Drug Screen Results and Make Report if [place], [place] 03/30/2020, 10:15 AM

## 2020-03-30 NOTE — Anesthesia Postprocedure Evaluation (Signed)
Anesthesia Post Note  Patient: Sonia James  Procedure(s) Performed: CESAREAN SECTION (N/A Abdomen)     Patient location during evaluation: PACU Anesthesia Type: Spinal Level of consciousness: oriented and awake and alert Pain management: pain level controlled Vital Signs Assessment: post-procedure vital signs reviewed and stable Respiratory status: spontaneous breathing, respiratory function stable and nonlabored ventilation Cardiovascular status: blood pressure returned to baseline and stable Postop Assessment: no headache, no backache, no apparent nausea or vomiting, spinal receding and patient able to bend at knees Anesthetic complications: no   No complications documented.                Oziel Beitler A.

## 2020-03-30 NOTE — Progress Notes (Signed)
POSTPARTUM PROGRESS NOTE  Subjective: Sonia James is a 25 y.o. G1P1001 s/p pLTCS at [redacted]w[redacted]d. POD#1.  She reports she doing well. No acute events overnight. She denies any problems with ambulating or po intake. Foley still in place. Denies nausea or vomiting. She has not passed flatus. Pain is well controlled.  Lochia is mild.  Objective: Blood pressure 125/63, pulse 83, temperature 97.7 F (36.5 C), temperature source Oral, resp. rate 16, height 5\' 5"  (1.651 m), weight 107.2 kg, SpO2 100 %, unknown if currently breastfeeding.  Physical Exam:  General: alert, cooperative and no distress Chest: no respiratory distress Abdomen: soft, non-tender. Honeycomb c/d/i.  Uterine Fundus: firm and at level of umbilicus Extremities: No calf swelling or tenderness  no edema  Recent Labs    03/29/20 1146 03/30/20 0548  HGB 10.4* 8.2*  HCT 33.9* 25.4*    Assessment/Plan: Sonia James is a 25 y.o. G1P1001 s/p pLTCS at [redacted]w[redacted]d for breech presentation. Doing well overall.  Routine Postpartum Care: Doing well, pain well-controlled.  -- Continue routine care, lactation support  -- Contraception: pp Liletta IUD placed at time of c section.  -- Feeding: bottle -- No prenatal care: SW consult  -- Trichomonas: found on admission. Started on flagyl 500 mg q12hrs for 7 days. F/u gonorrhea/chlamydia swab.    Dispo: Plan for discharge POD#2-3.  Janet Berlin, MD OB Fellow, [facility] 03/30/2020 8:36 AM

## 2020-03-31 DIAGNOSIS — A749 Chlamydial infection, unspecified: Secondary | ICD-10-CM

## 2020-03-31 LAB — CULTURE, BETA STREP (GROUP B ONLY)

## 2020-03-31 MED ORDER — TETANUS-DIPHTH-ACELL PERTUSSIS 5-2.5-18.5 LF-MCG/0.5 IM SUSY
0.5000 mL | PREFILLED_SYRINGE | Freq: Once | INTRAMUSCULAR | Status: AC
  Administered 2020-03-31: 0.5 mL via INTRAMUSCULAR
  Filled 2020-03-31: qty 0.5

## 2020-03-31 MED ORDER — IBUPROFEN 800 MG PO TABS: 800.0000 mg | ORAL_TABLET | Freq: Three times a day (TID) | ORAL | 0 refills | Status: AC

## 2020-03-31 MED ORDER — HYDROCODONE-ACETAMINOPHEN 5-325 MG PO TABS: 1.0000 | ORAL_TABLET | ORAL | 0 refills | Status: DC | PRN

## 2020-03-31 MED ORDER — METRONIDAZOLE 500 MG PO TABS: 500.0000 mg | ORAL_TABLET | Freq: Two times a day (BID) | ORAL | 0 refills | Status: AC

## 2020-03-31 MED ORDER — ACETAMINOPHEN 325 MG PO TABS: 650.0000 mg | ORAL_TABLET | ORAL | 0 refills | Status: DC | PRN

## 2020-03-31 NOTE — Discharge Instructions (Signed)

## 2020-04-01 LAB — SURGICAL PATHOLOGY

## 2020-04-08 ENCOUNTER — Encounter: Payer: Self-pay | Admitting: *Deleted

## 2020-04-08 ENCOUNTER — Other Ambulatory Visit: Payer: Self-pay

## 2020-04-08 ENCOUNTER — Ambulatory Visit (INDEPENDENT_AMBULATORY_CARE_PROVIDER_SITE_OTHER): Payer: Medicaid Other | Admitting: *Deleted

## 2020-04-08 VITALS — BP 116/70 | HR 81 | Ht 65.0 in | Wt 216.1 lb

## 2020-04-08 DIAGNOSIS — Z4889 Encounter for other specified surgical aftercare: Secondary | ICD-10-CM

## 2020-04-08 NOTE — Progress Notes (Signed)
Pt presents for incision check following C/S delivery on 03/29/20. She is walking well and reports minimal pain. Pt states she does not want Covid vaccine @ this time however would like additional information on this topic. I advised that she may discuss with provider @ scheduled visit on 2/8.  Honeycomb dressing removed and incision was found to be healing well. Skin glue covering incision remains intact. No drainage or bleeding was observed.  A small area of swelling and redness was noted @ upper edge of incision on Lt side. Pt did not report pain when touched. She was instructed to observe this area and report any changes to our office immediately. Daily cleansing of wound was explained. Pt has PP appt on 2/8. She voiced understanding of all information and instructions given.

## 2020-04-27 ENCOUNTER — Other Ambulatory Visit: Payer: Self-pay

## 2020-04-27 ENCOUNTER — Encounter: Payer: Self-pay | Admitting: Family Medicine

## 2020-04-27 ENCOUNTER — Ambulatory Visit (INDEPENDENT_AMBULATORY_CARE_PROVIDER_SITE_OTHER): Payer: Medicaid Other | Admitting: Family Medicine

## 2020-04-27 ENCOUNTER — Other Ambulatory Visit (HOSPITAL_COMMUNITY)
Admission: RE | Admit: 2020-04-27 | Discharge: 2020-04-27 | Disposition: A | Discharge status: Home / Self Care | Payer: Medicaid Other | Source: Ambulatory Visit | Attending: Family Medicine | Admitting: Family Medicine

## 2020-04-27 DIAGNOSIS — Z124 Encounter for screening for malignant neoplasm of cervix: Secondary | ICD-10-CM | POA: Diagnosis not present

## 2020-04-27 NOTE — Progress Notes (Signed)
[facility] Partum Visit Note  Sonia James is a 25 y.o. G73P1001 female who presents for a postpartum visit. She is 4 weeks postpartum following a primary cesarean section.  I have fully reviewed the prenatal and intrapartum course. The delivery was at 56 gestational weeks.  Anesthesia: spinal. Postpartum course has been unremarkable. Baby is doing well. Baby is feeding by bottle - gerber gentle. Bleeding thin lochia. Bowel function is normal. Bladder function is normal. Patient is sexually active. Contraception method is IUD. Postpartum depression screening: negative.   The pregnancy intention screening data noted above was reviewed.   Edinburgh Postnatal Depression Scale - 04/27/20 1351      Edinburgh Postnatal Depression Scale:  In the Past 7 Days   I have been able to laugh and see the funny side of things. 0    I have looked forward with enjoyment to things. 0    I have blamed myself unnecessarily when things went wrong. 0    I have been anxious or worried for no good reason. 0    I have felt scared or panicky for no good reason. 0    Things have been getting on top of me. 0    I have been so unhappy that I have had difficulty sleeping. 0    I have felt sad or miserable. 0    I have been so unhappy that I have been crying. 0    The thought of harming myself has occurred to me. 0    Edinburgh Postnatal Depression Scale Total 0            The following portions of the patient's history were reviewed and updated as appropriate: allergies, current medications, past family history, past medical history, past social history, past surgical history and problem list.  Review of Systems Pertinent items noted in HPI and remainder of comprehensive ROS otherwise negative.    Objective:  BP 131/71   Pulse 83   Ht 5\' 5"  (1.651 m)   Wt 219 lb 3.2 oz (99.4 kg)   BMI 36.48 kg/m    General:  alert, cooperative and appears stated age   Breasts:  deferred  Lungs: comfortable on room air   Heart:  deferred  Abdomen: soft, non-tender; bowel sounds normal; no masses,  no organomegaly   Vulva:  normal, IUD strings protruding outside of vagina  Vagina: normal vagina, small amount of blood in vault  Cervix:  normal, IUD strings trimmed to 4 cm  Corpus: not examined  Adnexa:  not evaluated  Rectal Exam: Not performed.        Assessment:    Normal postpartum exam. Pap smear done at today's visit.   Plan:   Essential components of care per ACOG recommendations:  1.  Mood and well being: Patient with negative depression screening today. Reviewed local resources for support.  - Patient does not use tobacco.  - hx of drug use? No   2. Infant care and feeding:  -Patient currently breastmilk feeding? No -Social determinants of health (SDOH) reviewed in EPIC. No concerns  3. Sexuality, contraception and birth spacing - Patient does not want a pregnancy in the next year.  Desired family size is 3 children.  - Reviewed forms of contraception in tiered fashion. Patient desired IUD today.   - Discussed birth spacing of 18 months  4. Sleep and fatigue -Encouraged family/partner/community support of 4 hrs of uninterrupted sleep to help with mood and fatigue  5. Physical Recovery  -  Discussed patients delivery and complications - Patient has urinary incontinence? No - Patient is safe to resume physical and sexual activity  6.  Health Maintenance - pap smear done today - encouraged to get COVID vaccine - treated for trichomonas on admission, TOC today   Clarnce Flock, [place] for [facility], [place]

## 2020-04-27 NOTE — Patient Instructions (Signed)

## 2020-05-04 LAB — CYTOLOGY - PAP
Chlamydia: NEGATIVE
Comment: NEGATIVE
Comment: NEGATIVE
Comment: NORMAL
Diagnosis: NEGATIVE
Neisseria Gonorrhea: NEGATIVE
Trichomonas: POSITIVE — AB

## 2020-05-04 MED ORDER — METRONIDAZOLE 500 MG PO TABS: 500.0000 mg | ORAL_TABLET | Freq: Two times a day (BID) | ORAL | 0 refills | Status: AC

## 2020-05-04 NOTE — Addendum Note (Signed)
Addended by: Clayton Lefort on: 05/04/2020 12:28 PM   Modules accepted: Orders

## 2020-05-17 ENCOUNTER — Telehealth: Payer: Self-pay

## 2020-05-17 NOTE — Telephone Encounter (Signed)
MyChart message sent on 05/04/20 with positive Trichomonas result is unread. Called pt with results. VM left requesting a call back or for pt to check MyChart.

## 2020-05-18 NOTE — Telephone Encounter (Signed)
Called pt; VM left stating I am calling with results. Call back requested; asked pt to check MyChart or mail for results. Letter sent.

## 2021-05-24 ENCOUNTER — Other Ambulatory Visit: Payer: Self-pay

## 2021-05-24 ENCOUNTER — Encounter (HOSPITAL_COMMUNITY): Payer: Self-pay | Admitting: Emergency Medicine

## 2021-05-24 ENCOUNTER — Emergency Department (HOSPITAL_COMMUNITY)
Admission: EM | Admit: 2021-05-24 | Discharge: 2021-05-25 | Disposition: A | Discharge status: Home / Self Care | Payer: Medicaid Other | Attending: Emergency Medicine | Admitting: Emergency Medicine

## 2021-05-24 DIAGNOSIS — M545 Low back pain, unspecified: Secondary | ICD-10-CM | POA: Diagnosis not present

## 2021-05-24 DIAGNOSIS — Z5321 Procedure and treatment not carried out due to patient leaving prior to being seen by health care provider: Secondary | ICD-10-CM | POA: Diagnosis not present

## 2021-05-24 DIAGNOSIS — R1110 Vomiting, unspecified: Secondary | ICD-10-CM | POA: Insufficient documentation

## 2021-05-24 LAB — CBC
HCT: 40.4 % (ref 36.0–46.0)
Hemoglobin: 12.8 g/dL (ref 12.0–15.0)
MCH: 27.9 pg (ref 26.0–34.0)
MCHC: 31.7 g/dL (ref 30.0–36.0)
MCV: 88 fL (ref 80.0–100.0)
Platelets: 404 10*3/uL — ABNORMAL HIGH (ref 150–400)
RBC: 4.59 MIL/uL (ref 3.87–5.11)
RDW: 15.2 % (ref 11.5–15.5)
WBC: 11.6 10*3/uL — ABNORMAL HIGH (ref 4.0–10.5)
nRBC: 0 % (ref 0.0–0.2)

## 2021-05-24 LAB — URINALYSIS, ROUTINE W REFLEX MICROSCOPIC
Bilirubin Urine: NEGATIVE
Glucose, UA: NEGATIVE mg/dL
Hgb urine dipstick: NEGATIVE
Ketones, ur: 80 mg/dL — AB
Leukocytes,Ua: NEGATIVE
Nitrite: NEGATIVE
Protein, ur: NEGATIVE mg/dL
Specific Gravity, Urine: 1.026 (ref 1.005–1.030)
pH: 5 (ref 5.0–8.0)

## 2021-05-24 LAB — COMPREHENSIVE METABOLIC PANEL
ALT: 14 U/L (ref 0–44)
AST: 16 U/L (ref 15–41)
Albumin: 4.2 g/dL (ref 3.5–5.0)
Alkaline Phosphatase: 101 U/L (ref 38–126)
Anion gap: 13 (ref 5–15)
BUN: 12 mg/dL (ref 6–20)
CO2: 22 mmol/L (ref 22–32)
Calcium: 9.2 mg/dL (ref 8.9–10.3)
Chloride: 104 mmol/L (ref 98–111)
Creatinine, Ser: 0.87 mg/dL (ref 0.44–1.00)
GFR, Estimated: >60 mL/min (ref >60)
Glucose, Bld: 115 mg/dL — ABNORMAL HIGH (ref 70–99)
Potassium: 3.6 mmol/L (ref 3.5–5.1)
Sodium: 139 mmol/L (ref 135–145)
Total Bilirubin: 0.7 mg/dL (ref 0.3–1.2)
Total Protein: 8.2 g/dL — ABNORMAL HIGH (ref 6.5–8.1)

## 2021-05-24 LAB — I-STAT BETA HCG BLOOD, ED (MC, WL, AP ONLY): I-stat hCG, quantitative: <5 m[IU]/mL (ref <5)

## 2021-05-24 LAB — LIPASE, BLOOD: Lipase: 18 U/L (ref 11–51)

## 2021-05-24 NOTE — ED Triage Notes (Signed)
Pt reported to ED with c/o emesis since approximately 5 am without abdominal pain. States she ate at a restaurant this am with family member, that family members also reports vomiting as well. Endorses lower back pain, but denies any urinary symptoms.  ?

## 2021-05-25 NOTE — ED Notes (Signed)
Pt decided to leave while waiting for a room.  

## 2021-10-03 ENCOUNTER — Emergency Department (HOSPITAL_COMMUNITY)
Admission: EM | Admit: 2021-10-03 | Discharge: 2021-10-04 | Disposition: A | Discharge status: Home / Self Care | Payer: Medicaid Other | Attending: Emergency Medicine | Admitting: Emergency Medicine

## 2021-10-03 ENCOUNTER — Encounter (HOSPITAL_COMMUNITY): Payer: Self-pay

## 2021-10-03 ENCOUNTER — Other Ambulatory Visit: Payer: Self-pay

## 2021-10-03 DIAGNOSIS — N9489 Other specified conditions associated with female genital organs and menstrual cycle: Secondary | ICD-10-CM | POA: Diagnosis not present

## 2021-10-03 DIAGNOSIS — J45909 Unspecified asthma, uncomplicated: Secondary | ICD-10-CM | POA: Insufficient documentation

## 2021-10-03 DIAGNOSIS — N12 Tubulo-interstitial nephritis, not specified as acute or chronic: Secondary | ICD-10-CM | POA: Insufficient documentation

## 2021-10-03 DIAGNOSIS — R109 Unspecified abdominal pain: Secondary | ICD-10-CM | POA: Diagnosis present

## 2021-10-03 HISTORY — DX: Unspecified asthma, uncomplicated: J45.909

## 2021-10-03 LAB — CBC WITH DIFFERENTIAL/PLATELET
Abs Immature Granulocytes: 0.02 10*3/uL (ref 0.00–0.07)
Basophils Absolute: 0 10*3/uL (ref 0.0–0.1)
Basophils Relative: 0 %
Eosinophils Absolute: 0.1 10*3/uL (ref 0.0–0.5)
Eosinophils Relative: 1 %
HCT: 37 % (ref 36.0–46.0)
Hemoglobin: 12.1 g/dL (ref 12.0–15.0)
Immature Granulocytes: 0 %
Lymphocytes Relative: 37 %
Lymphs Abs: 2.5 10*3/uL (ref 0.7–4.0)
MCH: 28.7 pg (ref 26.0–34.0)
MCHC: 32.7 g/dL (ref 30.0–36.0)
MCV: 87.7 fL (ref 80.0–100.0)
Monocytes Absolute: 0.5 10*3/uL (ref 0.1–1.0)
Monocytes Relative: 8 %
Neutro Abs: 3.7 10*3/uL (ref 1.7–7.7)
Neutrophils Relative %: 54 %
Platelets: 343 10*3/uL (ref 150–400)
RBC: 4.22 MIL/uL (ref 3.87–5.11)
RDW: 15.8 % — ABNORMAL HIGH (ref 11.5–15.5)
WBC: 6.9 10*3/uL (ref 4.0–10.5)
nRBC: 0 % (ref 0.0–0.2)

## 2021-10-03 LAB — URINALYSIS, ROUTINE W REFLEX MICROSCOPIC
Bilirubin Urine: NEGATIVE
Glucose, UA: NEGATIVE mg/dL
Ketones, ur: NEGATIVE mg/dL
Nitrite: NEGATIVE
Protein, ur: NEGATIVE mg/dL
Specific Gravity, Urine: 1.015 (ref 1.005–1.030)
WBC, UA: >50 WBC/hpf — ABNORMAL HIGH (ref 0–5)
pH: 8 (ref 5.0–8.0)

## 2021-10-03 LAB — COMPREHENSIVE METABOLIC PANEL
ALT: 14 U/L (ref 0–44)
AST: 14 U/L — ABNORMAL LOW (ref 15–41)
Albumin: 3.9 g/dL (ref 3.5–5.0)
Alkaline Phosphatase: 90 U/L (ref 38–126)
Anion gap: 6 (ref 5–15)
BUN: 9 mg/dL (ref 6–20)
CO2: 25 mmol/L (ref 22–32)
Calcium: 9.3 mg/dL (ref 8.9–10.3)
Chloride: 107 mmol/L (ref 98–111)
Creatinine, Ser: 0.75 mg/dL (ref 0.44–1.00)
GFR, Estimated: >60 mL/min (ref >60)
Glucose, Bld: 79 mg/dL (ref 70–99)
Potassium: 3.8 mmol/L (ref 3.5–5.1)
Sodium: 138 mmol/L (ref 135–145)
Total Bilirubin: 0.6 mg/dL (ref 0.3–1.2)
Total Protein: 7.7 g/dL (ref 6.5–8.1)

## 2021-10-03 LAB — I-STAT BETA HCG BLOOD, ED (MC, WL, AP ONLY): I-stat hCG, quantitative: <5 m[IU]/mL (ref <5)

## 2021-10-03 NOTE — ED Triage Notes (Signed)
Right groin pain, started 4 days ago, tried OTC meds but unable to get relief, Had C-section 18 months ago, pain radiates from right groin up toward her abdomin.

## 2021-10-03 NOTE — ED Provider Triage Note (Signed)
Emergency Medicine Provider Triage Evaluation Note  Sonia James , a 26 y.o. female  was evaluated in triage.  Pt complains of RLQ p[ain. Started 3 days ago now radiating to the R flank. NO urinary or vaginal sxs  Review of Systems  Positive: RLQ pain  Negative: fever  Physical Exam  BP 118/63 (BP Location: Left Arm)   Pulse 71   Temp 98.4 F (36.9 C) (Oral)   Resp 18   Ht '5\' 2"'$  (1.575 m)   Wt 99.8 kg   SpO2 100%   BMI 40.24 kg/m  Gen:   Awake, no distress   Resp:  Normal effort  MSK:   Moves extremities without difficulty  Other:  TTP RLQ  Medical Decision Making  Medically screening exam initiated at 3:05 PM.  Appropriate orders placed.  Sonia James was informed that the remainder of the evaluation will be completed by another provider, this initial triage assessment does not replace that evaluation, and the importance of remaining in the ED until their evaluation is complete.  Work up [employer], [place], PA-C 10/03/21 1507

## 2021-10-04 ENCOUNTER — Emergency Department (HOSPITAL_COMMUNITY): Payer: Medicaid Other

## 2021-10-04 MED ORDER — SODIUM CHLORIDE 0.9 % IV SOLN
1.0000 g | Freq: Once | INTRAVENOUS | Status: AC
  Administered 2021-10-04: 1 g via INTRAVENOUS
  Filled 2021-10-04: qty 10

## 2021-10-04 MED ORDER — MORPHINE SULFATE (PF) 4 MG/ML IV SOLN
4.0000 mg | Freq: Once | INTRAVENOUS | Status: AC
  Administered 2021-10-04: 4 mg via INTRAVENOUS
  Filled 2021-10-04: qty 1

## 2021-10-04 MED ORDER — ONDANSETRON HCL 4 MG/2ML IJ SOLN
4.0000 mg | Freq: Once | INTRAMUSCULAR | Status: AC
  Administered 2021-10-04: 4 mg via INTRAVENOUS
  Filled 2021-10-04: qty 2

## 2021-10-04 MED ORDER — IOHEXOL 300 MG/ML  SOLN
100.0000 mL | Freq: Once | INTRAMUSCULAR | Status: AC | PRN
  Administered 2021-10-04: 100 mL via INTRAVENOUS

## 2021-10-04 MED ORDER — CEPHALEXIN 500 MG PO CAPS: 500.0000 mg | ORAL_CAPSULE | Freq: Three times a day (TID) | ORAL | 0 refills | Status: AC

## 2021-10-04 NOTE — Discharge Instructions (Signed)
You were seen today for right lower quadrant and right flank pain.  Your work-up is consistent with a urinary tract infection that is likely affecting your kidneys.  Take medication as prescribed.  Take ibuprofen for pain.  If you develop fevers, nausea, vomiting, any new or worsening symptoms, you should be reevaluated.

## 2021-10-04 NOTE — ED Provider Notes (Signed)
[facility] EMERGENCY DEPARTMENT Provider Note   CSN: [id number] Arrival date & time: 10/03/21  1241     History  Chief Complaint  Patient presents with   Groin Pain    Right Groin pain starting at C-section incision and moving up toward her abdomim    Sonia James is a 26 y.o. female.  HPI     This is a 26 year old female who presents with right-sided abdominal pain.  Patient reports 3 to 4-day history of right-sided abdominal pain that radiates into the right flank.  She states that it started in the umbilicus area and now has settled into her right lower quadrant.  Denies hematuria or dysuria.  Does not believe herself to be pregnant.  Has not had any fevers.  Denies any nausea or vomiting.  No change in bowel movements.  Patient took over-the-counter medications without any significant relief.  Home Medications Prior to Admission medications   Medication Sig Start Date End Date Taking? Authorizing Provider  cephALEXin (KEFLEX) 500 MG capsule Take 1 capsule (500 mg total) by mouth 3 (three) times daily. 10/04/21  Yes Tera Pellicane, Barbette Hair, MD  ibuprofen (ADVIL) 800 MG tablet Take 1 tablet (800 mg total) by mouth every 8 (eight) hours. 03/31/20   Gladys Damme, MD  Prenatal Vit-Fe Fumarate-FA (PRENATAL MULTIVITAMIN) TABS tablet Take 1 tablet by mouth daily at 12 noon.    [provider]      Allergies    Patient has no known allergies.    Review of Systems   Review of Systems  Constitutional:  Negative for fever.  Gastrointestinal:  Positive for abdominal pain. Negative for nausea and vomiting.  All other systems reviewed and are negative.   Physical Exam Updated Vital Signs BP 132/72   Pulse 74   Temp 97.9 F (36.6 C) (Oral)   Resp 16   Ht 1.575 m ('5\' 2"'$ )   Wt 99.8 kg   SpO2 96%   BMI 40.24 kg/m  Physical Exam Vitals and nursing note reviewed.  Constitutional:      Appearance: She is well-developed. She is not ill-appearing.  HENT:      Head: Normocephalic and atraumatic.  Eyes:     Pupils: Pupils are equal, round, and reactive to light.  Cardiovascular:     Rate and Rhythm: Normal rate and regular rhythm.     Heart sounds: Normal heart sounds.  Pulmonary:     Effort: Pulmonary effort is normal. No respiratory distress.     Breath sounds: No wheezing.  Abdominal:     General: Bowel sounds are normal.     Palpations: Abdomen is soft.     Tenderness: There is abdominal tenderness. There is no guarding or rebound.     Comments: Right lower quadrant and right flank tenderness to palpation  Musculoskeletal:     Cervical back: Neck supple.     Left lower leg: No edema.  Skin:    General: Skin is warm and dry.  Neurological:     Mental Status: She is alert and oriented to person, place, and time.  Psychiatric:        Mood and Affect: Mood normal.     ED Results / Procedures / Treatments   Labs (all labs ordered are listed, but only abnormal results are displayed) Labs Reviewed  COMPREHENSIVE METABOLIC PANEL - Abnormal; Notable for the following components:      Result Value   AST 14 (*)    All other components  within normal limits  CBC WITH DIFFERENTIAL/PLATELET - Abnormal; Notable for the following components:   RDW 15.8 (*)    All other components within normal limits  URINALYSIS, ROUTINE W REFLEX MICROSCOPIC - Abnormal; Notable for the following components:   APPearance HAZY (*)    Hgb urine dipstick SMALL (*)    Leukocytes,Ua MODERATE (*)    WBC, UA >50 (*)    Bacteria, UA FEW (*)    All other components within normal limits  URINE CULTURE  I-STAT BETA HCG BLOOD, ED ([facility], [facility], [facility] ONLY)    EKG None  Radiology CT Abdomen Pelvis W Contrast  Result Date: 10/04/2021 CLINICAL DATA:  Right lower quadrant abdominal pain radiating to right flank. EXAM: CT ABDOMEN AND PELVIS WITH CONTRAST TECHNIQUE: Multidetector CT imaging of the abdomen and pelvis was performed using the standard protocol following  bolus administration of intravenous contrast. RADIATION DOSE REDUCTION: This exam was performed according to the departmental dose-optimization program which includes automated exposure control, adjustment of the mA and/or kV according to patient size and/or use of iterative reconstruction technique. CONTRAST:  155m OMNIPAQUE IOHEXOL 300 MG/ML  SOLN COMPARISON:  None Available. FINDINGS: Lower chest: No acute abnormality. Hepatobiliary: No focal liver abnormality is seen. No gallstones, gallbladder wall thickening, or biliary dilatation. Pancreas: Unremarkable. No pancreatic ductal dilatation or surrounding inflammatory changes. Spleen: Normal in size without focal abnormality. Adrenals/Urinary Tract: No adrenal nodule or mass. No renal calculus or obstructive uropathy bilaterally. Patchy hypoenhancement is noted in the upper pole of the right kidney. The bladder is unremarkable. Stomach/Bowel: Stomach is within normal limits. The visualized portion of the appendix is within normal limits. No evidence of bowel wall thickening, distention, or inflammatory changes. No free air or pneumatosis. Vascular/Lymphatic: No significant vascular findings are present. No enlarged abdominal or pelvic lymph nodes. Reproductive: An IUD is present in the uterus. A rim enhancing cystic structure is noted in the right adnexa, possible corpus luteal or hemorrhagic cyst. Other: Small amount of free fluid in the cul-de-sac which may be physiologic. There is diastasis of the rectus abdominus with a small fat containing umbilical hernia. Musculoskeletal: No acute osseous abnormality. IMPRESSION: Patchy hypoenhancement of the upper pole the right kidney suggesting pyelonephritis. Electronically Signed   By: LBrett FairyM.D.   On: 10/04/2021 03:50    Procedures Procedures    Medications Ordered in ED Medications  cefTRIAXone (ROCEPHIN) 1 g in sodium chloride 0.9 % 100 mL IVPB (1 g Intravenous New Bag/Given 10/04/21 0408)  morphine  (PF) 4 MG/ML injection 4 mg (4 mg Intravenous Given 10/04/21 0249)  ondansetron (ZOFRAN) injection 4 mg (4 mg Intravenous Given 10/04/21 0249)  iohexol (OMNIPAQUE) 300 MG/ML solution 100 mL (100 mLs Intravenous Contrast Given 10/04/21 06063    ED Course/ Medical Decision Making/ A&P                           Medical Decision Making Amount and/or Complexity of Data Reviewed Labs: ordered.  Risk Prescription drug management.   This patient presents to the ED for concern of right lower quadrant, right flank pain, this involves an extensive number of treatment options, and is a complaint that carries with it a high risk of complications and morbidity.  I considered the following differential and admission for this acute, potentially life threatening condition.  The differential diagnosis includes appendicitis, UTI, kidney stone, ovarian pathology  MDM:    This is a 26year old female who presents  with right lower quadrant and right flank pain.  She is nontoxic.  Vital signs are reassuring.  She is afebrile.  She has some tenderness without signs of peritonitis.  She also has some right flank tenderness.  Labs obtained.  No significant leukocytosis.  No metabolic derangements.  She is not pregnant.  Urinalysis shows moderate leukocyte Estrace with greater than 50 white cells and few bacteria.  Urine culture was sent.  Given concern for possible infected stone versus appendicitis, CT scan was obtained.  CT scan is consistent with likely early pyelonephritis.  Patient is clinically well-appearing without fevers or leukocytosis.  Patient was given 1 dose of IV Rocephin.  Will discharge with Keflex.  She was given strict return precautions.  (Labs, imaging, consults)  Labs: I Ordered, and personally interpreted labs.  The pertinent results include: CBC, CMP, beta-hCG, urinalysis, urine culture  Imaging Studies ordered: I ordered imaging studies including CT abdomen and pelvis I independently  visualized and interpreted imaging. I agree with the radiologist interpretation  Additional history obtained from chart review.  External records from outside source obtained and reviewed including prior evaluations  Cardiac Monitoring: The patient was maintained on a cardiac monitor.  I personally viewed and interpreted the cardiac monitored which showed an underlying rhythm of: Normal sinus rhythm  Reevaluation: After the interventions noted above, I reevaluated the patient and found that they have :improved  Social Determinants of Health: Lives independently  Disposition: Discharge  Co morbidities that complicate the patient evaluation  Past Medical History:  Diagnosis Date   Anemia    Asthma      Medicines Meds ordered this encounter  Medications   morphine (PF) 4 MG/ML injection 4 mg   ondansetron (ZOFRAN) injection 4 mg   iohexol (OMNIPAQUE) 300 MG/ML solution 100 mL   cefTRIAXone (ROCEPHIN) 1 g in sodium chloride 0.9 % 100 mL IVPB    Order Specific Question:   Antibiotic Indication:    Answer:   UTI   cephALEXin (KEFLEX) 500 MG capsule    Sig: Take 1 capsule (500 mg total) by mouth 3 (three) times daily.    Dispense:  21 capsule    Refill:  0    I have reviewed the patients home medicines and have made adjustments as needed  Problem List / ED Course: Problem List Items Addressed This Visit   None Visit Diagnoses     Pyelonephritis    -  Primary   Relevant Medications   cefTRIAXone (ROCEPHIN) 1 g in sodium chloride 0.9 % 100 mL IVPB   cephALEXin (KEFLEX) 500 MG capsule                   Final Clinical Impression(s) / ED Diagnoses Final diagnoses:  Pyelonephritis    Rx / DC Orders ED Discharge Orders          Ordered    cephALEXin (KEFLEX) 500 MG capsule  3 times daily        10/04/21 0425              Lissette Schenk, Barbette Hair, MD 10/04/21 [phone]

## 2021-10-06 LAB — URINE CULTURE

## 2022-12-19 HISTORY — PX: CARPAL TUNNEL RELEASE: SHX101

## 2023-03-21 HISTORY — PX: WRIST GANGLION EXCISION: SUR520

## 2023-05-31 ENCOUNTER — Encounter: Payer: Self-pay | Admitting: Emergency Medicine

## 2023-05-31 ENCOUNTER — Ambulatory Visit: Admission: EM | Admit: 2023-05-31 | Discharge: 2023-05-31 | Disposition: A | Discharge status: Home / Self Care

## 2023-05-31 DIAGNOSIS — R112 Nausea with vomiting, unspecified: Secondary | ICD-10-CM | POA: Diagnosis not present

## 2023-05-31 DIAGNOSIS — R197 Diarrhea, unspecified: Secondary | ICD-10-CM | POA: Diagnosis not present

## 2023-05-31 LAB — POCT INFLUENZA A/B
Influenza A, POC: NEGATIVE
Influenza B, POC: NEGATIVE

## 2023-05-31 MED ORDER — ONDANSETRON 4 MG PO TBDP: 4.0000 mg | ORAL_TABLET | Freq: Three times a day (TID) | ORAL | 0 refills | Status: AC | PRN

## 2023-05-31 NOTE — ED Provider Notes (Signed)
 [facility]    CSN: [id number] Arrival date & time: 05/31/23  1030      History   Chief Complaint Chief Complaint  Patient presents with   Diarrhea   Nausea   Emesis    HPI Sonia James is a 28 y.o. female.   Patient presents with nausea, vomiting, diarrhea, chills, generalized bodyaches that started yesterday.  Denies any documented fevers.  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  Last menstrual cycle was 05/18/2023.  She has been able to tolerate a little bit of fluids this morning.   Diarrhea Emesis   Past Medical History:  Diagnosis Date   Anemia    Asthma     Patient Active Problem List   Diagnosis Date Noted   Cesarean delivery delivered 03/29/2020   Trichomoniasis 03/29/2020   No prenatal care in current pregnancy 03/29/2020   IUD (intrauterine device) in place 03/29/2020   Breech presentation of fetus 03/29/2020    Past Surgical History:  Procedure Laterality Date   CESAREAN SECTION N/A 03/29/2020   Procedure: CESAREAN SECTION;  Surgeon: Venora Maples, MD;  Location: [facility]S;  Service: Obstetrics;  Laterality: N/A;   CESAREAN SECTION      OB History     Gravida  1   Para  1   Term  1   Preterm      AB      Living  1      SAB      IAB      Ectopic      Multiple  0   Live Births  1            Home Medications    Prior to Admission medications   Medication Sig Start Date End Date Taking? Authorizing Provider  ondansetron (ZOFRAN-ODT) 4 MG disintegrating tablet Take 1 tablet (4 mg total) by mouth every 8 (eight) hours as needed for nausea or vomiting. 05/31/23  Yes Ervin Knack E, FNP  oxyCODONE (OXY IR/ROXICODONE) 5 MG immediate release tablet Take 5 mg by mouth every 6 (six) hours. 04/05/23  Yes [provider]  cephALEXin (KEFLEX) 500 MG capsule Take 1 capsule (500 mg total) by mouth 3 (three) times daily. Patient not taking: Reported on  05/31/2023 10/04/21   Horton, Mayer Masker, MD  ibuprofen (ADVIL) 800 MG tablet Take 1 tablet (800 mg total) by mouth every 8 (eight) hours. 03/31/20  Yes Shirlean Mylar, MD  Prenatal Vit-Fe Fumarate-FA (PRENATAL MULTIVITAMIN) TABS tablet Take 1 tablet by mouth daily at 12 noon. Patient not taking: Reported on 05/31/2023    [provider]    Family History Family History  Problem Relation Age of Onset   Thyroid disease Mother     Social History Social History   Tobacco Use   Smoking status: Never   Smokeless tobacco: Never  Vaping Use   Vaping status: Never Used  Substance Use Topics   Alcohol use: Never   Drug use: No     Allergies   Patient has no known allergies.   Review of Systems Review of Systems Per HPI  Physical Exam Triage Vital Signs ED Triage Vitals [05/31/23 1101]  Encounter Vitals Group     BP 113/73     Systolic BP Percentile      Diastolic BP Percentile      Pulse Rate 84     Resp 18  Temp 98.5 F (36.9 C)     Temp Source Oral     SpO2 98 %     Weight      Height      Head Circumference      Peak Flow      Pain Score 9     Pain Loc      Pain Education      Exclude from Growth Chart    No data found.  Updated Vital Signs BP 113/73 (BP Location: Left Arm)   Pulse 84   Temp 98.5 F (36.9 C) (Oral)   Resp 18   LMP 05/18/2023 (Exact Date)   SpO2 98%   Breastfeeding No   Visual Acuity Right Eye Distance:   Left Eye Distance:   Bilateral Distance:    Right Eye Near:   Left Eye Near:    Bilateral Near:     Physical Exam Constitutional:      General: She is not in acute distress.    Appearance: Normal appearance. She is not toxic-appearing or diaphoretic.  HENT:     Head: Normocephalic and atraumatic.     Mouth/Throat:     Mouth: Mucous membranes are moist.     Pharynx: No posterior oropharyngeal erythema.  Eyes:     Extraocular Movements: Extraocular movements intact.     Conjunctiva/sclera: Conjunctivae normal.   Cardiovascular:     Rate and Rhythm: Normal rate and regular rhythm.     Pulses: Normal pulses.     Heart sounds: Normal heart sounds.  Pulmonary:     Effort: Pulmonary effort is normal. No respiratory distress.     Breath sounds: Normal breath sounds.  Neurological:     General: No focal deficit present.     Mental Status: She is alert and oriented to person, place, and time. Mental status is at baseline.  Psychiatric:        Mood and Affect: Mood normal.        Behavior: Behavior normal.        Thought Content: Thought content normal.        Judgment: Judgment normal.      UC Treatments / Results  Labs (all labs ordered are listed, but only abnormal results are displayed) Labs Reviewed  POCT INFLUENZA A/B - Normal    EKG   Radiology No results found.  Procedures Procedures (including critical care time)  Medications Ordered in UC Medications - No data to display  Initial Impression / Assessment and Plan / UC Course  I have reviewed the triage vital signs and the nursing notes.  Pertinent labs & imaging results that were available during my care of the patient were reviewed by me and considered in my medical decision making (see chart for details).     Suspect viral cause to symptoms.  There are no signs of dehydration or acute abdomen on exam.  Will prescribe ondansetron to take as needed for nausea.  Advised adequate fluids, rest, bland diet.  Patient advised to follow-up at the emergency department if symptoms persist or worsen.  Patient verbalized understanding and was agreeable with plan. Final Clinical Impressions(s) / UC Diagnoses   Final diagnoses:  Nausea vomiting and diarrhea     Discharge Instructions      It appears that you have a stomach virus.  This should run its course.  I have prescribed you nausea medication to take as needed.  Ensure adequate fluids, rest, bland diet as we discussed.  Follow-up  with the emergency department if symptoms  persist or worsen.    ED Prescriptions     Medication Sig Dispense Auth. Provider   ondansetron (ZOFRAN-ODT) 4 MG disintegrating tablet Take 1 tablet (4 mg total) by mouth every 8 (eight) hours as needed for nausea or vomiting. 20 tablet [place], Acie Fredrickson, [place]      PDMP not reviewed this encounter.   Gustavus Bryant, [place] 05/31/23 1400

## 2023-05-31 NOTE — ED Triage Notes (Signed)
 Pt reports nausea, vomiting, diarrhea, chills, and body aches that started yesterday. Last emesis episode was 3 am. 5 watery stools in last 24hrs. Denies fevers. Not currently nauseous. Able to keep juice down this morning. No med use for symptoms.

## 2023-05-31 NOTE — Discharge Instructions (Signed)
 It appears that you have a stomach virus.  This should run its course.  I have prescribed you nausea medication to take as needed.  Ensure adequate fluids, rest, bland diet as we discussed.  Follow-up with the emergency department if symptoms persist or worsen.

## 2023-10-01 ENCOUNTER — Ambulatory Visit (HOSPITAL_COMMUNITY)

## 2023-10-01 ENCOUNTER — Other Ambulatory Visit: Payer: Self-pay

## 2023-10-01 ENCOUNTER — Emergency Department (HOSPITAL_COMMUNITY)
Admission: EM | Admit: 2023-10-01 | Discharge: 2023-10-01 | Disposition: A | Discharge status: Home / Self Care | Attending: Emergency Medicine | Admitting: Emergency Medicine

## 2023-10-01 ENCOUNTER — Emergency Department (HOSPITAL_COMMUNITY)

## 2023-10-01 ENCOUNTER — Encounter (HOSPITAL_COMMUNITY): Payer: Self-pay | Admitting: *Deleted

## 2023-10-01 DIAGNOSIS — J45909 Unspecified asthma, uncomplicated: Secondary | ICD-10-CM | POA: Diagnosis not present

## 2023-10-01 DIAGNOSIS — M549 Dorsalgia, unspecified: Secondary | ICD-10-CM | POA: Diagnosis present

## 2023-10-01 DIAGNOSIS — N12 Tubulo-interstitial nephritis, not specified as acute or chronic: Secondary | ICD-10-CM | POA: Insufficient documentation

## 2023-10-01 LAB — CBC
HCT: 37.6 % (ref 36.0–46.0)
Hemoglobin: 12.4 g/dL (ref 12.0–15.0)
MCH: 29.9 pg (ref 26.0–34.0)
MCHC: 33 g/dL (ref 30.0–36.0)
MCV: 90.6 fL (ref 80.0–100.0)
Platelets: 329 K/uL (ref 150–400)
RBC: 4.15 MIL/uL (ref 3.87–5.11)
RDW: 14.6 % (ref 11.5–15.5)
WBC: 8.1 K/uL (ref 4.0–10.5)
nRBC: 0 % (ref 0.0–0.2)

## 2023-10-01 LAB — URINALYSIS, ROUTINE W REFLEX MICROSCOPIC
Bilirubin Urine: NEGATIVE
Glucose, UA: NEGATIVE mg/dL
Ketones, ur: NEGATIVE mg/dL
Nitrite: NEGATIVE
Protein, ur: 100 mg/dL — AB
RBC / HPF: >50 RBC/hpf (ref 0–5)
Specific Gravity, Urine: 1.011 (ref 1.005–1.030)
WBC, UA: >50 WBC/hpf (ref 0–5)
pH: 5 (ref 5.0–8.0)

## 2023-10-01 LAB — BASIC METABOLIC PANEL WITH GFR
Anion gap: 10 (ref 5–15)
BUN: 10 mg/dL (ref 6–20)
CO2: 22 mmol/L (ref 22–32)
Calcium: 9.1 mg/dL (ref 8.9–10.3)
Chloride: 104 mmol/L (ref 98–111)
Creatinine, Ser: 0.79 mg/dL (ref 0.44–1.00)
GFR, Estimated: >60 mL/min (ref >60)
Glucose, Bld: 99 mg/dL (ref 70–99)
Potassium: 3.9 mmol/L (ref 3.5–5.1)
Sodium: 136 mmol/L (ref 135–145)

## 2023-10-01 LAB — HCG, QUANTITATIVE, PREGNANCY: hCG, Beta Chain, Quant, S: 1 m[IU]/mL (ref <5)

## 2023-10-01 MED ORDER — MORPHINE SULFATE (PF) 4 MG/ML IV SOLN
4.0000 mg | Freq: Once | INTRAVENOUS | Status: AC
  Administered 2023-10-01: 4 mg via INTRAVENOUS
  Filled 2023-10-01: qty 1

## 2023-10-01 MED ORDER — CEFTRIAXONE SODIUM 1 G IJ SOLR
1.0000 g | Freq: Once | INTRAMUSCULAR | Status: AC
  Administered 2023-10-01: 1 g via INTRAVENOUS
  Filled 2023-10-01: qty 10

## 2023-10-01 MED ORDER — OXYCODONE-ACETAMINOPHEN 5-325 MG PO TABS: 1.0000 | ORAL_TABLET | Freq: Four times a day (QID) | ORAL | 0 refills | Status: AC | PRN

## 2023-10-01 MED ORDER — ONDANSETRON HCL 4 MG/2ML IJ SOLN
4.0000 mg | Freq: Once | INTRAMUSCULAR | Status: AC
  Administered 2023-10-01: 4 mg via INTRAVENOUS
  Filled 2023-10-01: qty 2

## 2023-10-01 MED ORDER — CEFADROXIL 500 MG PO CAPS: 1000.0000 mg | ORAL_CAPSULE | Freq: Two times a day (BID) | ORAL | 0 refills | Status: AC

## 2023-10-01 MED ORDER — FENTANYL CITRATE PF 50 MCG/ML IJ SOSY
25.0000 ug | PREFILLED_SYRINGE | Freq: Once | INTRAMUSCULAR | Status: AC
  Administered 2023-10-01: 25 ug via INTRAVENOUS
  Filled 2023-10-01: qty 1

## 2023-10-01 MED ORDER — KETOROLAC TROMETHAMINE 30 MG/ML IJ SOLN
30.0000 mg | Freq: Once | INTRAMUSCULAR | Status: AC
Start: 2023-10-01 — End: 2023-10-01
  Administered 2023-10-01: 30 mg via INTRAVENOUS
  Filled 2023-10-01: qty 1

## 2023-10-01 NOTE — Discharge Instructions (Addendum)
 You are seen in the emergency department today for concerns of back pain.  Your urine is consistent with an infection and given your area of pain, I suspect likely have a mild kidney infection.  Your CT scan is thankfully normal with exception of what appears to be a malpositioned IUD.  Please follow-up with your OB/GYN for this.  I am starting you on a prescription antibiotic called cefadroxil  which she will take twice daily for the next 10 days.  I am also sending you home with a short course of pain medication for further pain relief.  For any concerns of new or worsening symptoms, return to the emergency department.

## 2023-10-01 NOTE — ED Provider Notes (Signed)
 [place] EMERGENCY DEPARTMENT AT [facility] Provider Note   CSN: [id number] Arrival date & time: 10/01/23  9343     Patient presents with: Back Pain   Sonia James is a 28 y.o. female.  Patient without significant medical history presents emergency department concerns of back pain.  Reports her back pain started yesterday and is severe in nature.  Also Dors is some discomfort with urination.  Endorses past history of kidney infections but no history of kidney stones.  Denies any obvious hematuria as far she is able to tell.  No reported fever, chills or bodyaches.    Back Pain      Prior to Admission medications   Medication Sig Start Date End Date Taking? Authorizing Provider  cefadroxil  (DURICEF) 500 MG capsule Take 2 capsules (1,000 mg total) by mouth 2 (two) times daily for 10 days. 10/01/23 10/11/23 Yes Tuyen Uncapher A, PA-C  oxyCODONE -acetaminophen  (PERCOCET/ROXICET) 5-325 MG tablet Take 1 tablet by mouth every 6 (six) hours as needed for severe pain (pain score 7-10). 10/01/23  Yes Yusuke Beza, [place] LABOR, PA-C  cephALEXin  (KEFLEX ) 500 MG capsule Take 1 capsule (500 mg total) by mouth 3 (three) times daily. Patient not taking: Reported on 05/31/2023 10/04/21   Horton, Charmaine FALCON, MD  ibuprofen  (ADVIL ) 800 MG tablet Take 1 tablet (800 mg total) by mouth every 8 (eight) hours. 03/31/20   Mahoney, Caitlin, MD  ondansetron  (ZOFRAN -ODT) 4 MG disintegrating tablet Take 1 tablet (4 mg total) by mouth every 8 (eight) hours as needed for nausea or vomiting. 05/31/23   Hazen Darryle BRAVO, FNP  oxyCODONE  (OXY IR/ROXICODONE ) 5 MG immediate release tablet Take 5 mg by mouth every 6 (six) hours. 04/05/23   [provider]  Prenatal Vit-Fe Fumarate-FA (PRENATAL MULTIVITAMIN) TABS tablet Take 1 tablet by mouth daily at 12 noon. Patient not taking: Reported on 05/31/2023    [provider]    Allergies: Patient has no known allergies.    Review of Systems  Musculoskeletal:   Positive for back pain.  All other systems reviewed and are negative.   Updated Vital Signs BP (!) 104/55 (BP Location: Right Arm)   Pulse 69   Temp 97.6 F (36.4 C) (Oral)   Resp 18   Ht 5' 5 (1.651 m)   Wt 101.2 kg   SpO2 99%   BMI 37.11 kg/m   Physical Exam Vitals and nursing note reviewed.  Constitutional:      General: She is not in acute distress.    Appearance: She is well-developed.  HENT:     Head: Normocephalic and atraumatic.  Eyes:     Conjunctiva/sclera: Conjunctivae normal.  Cardiovascular:     Rate and Rhythm: Normal rate and regular rhythm.     Heart sounds: No murmur heard. Pulmonary:     Effort: Pulmonary effort is normal. No respiratory distress.     Breath sounds: Normal breath sounds.  Abdominal:     General: Abdomen is flat. Bowel sounds are normal. There is no distension.     Palpations: Abdomen is soft.     Tenderness: There is no abdominal tenderness. There is right CVA tenderness and left CVA tenderness. There is no guarding.  Musculoskeletal:        General: No swelling.     Cervical back: Neck supple.  Skin:    General: Skin is warm and dry.     Capillary Refill: Capillary refill takes less than 2 seconds.  Neurological:  Mental Status: She is alert.  Psychiatric:        Mood and Affect: Mood normal.     (all labs ordered are listed, but only abnormal results are displayed) Labs Reviewed  URINALYSIS, ROUTINE W REFLEX MICROSCOPIC - Abnormal; Notable for the following components:      Result Value   APPearance HAZY (*)    Hgb urine dipstick MODERATE (*)    Protein, ur 100 (*)    Leukocytes,Ua LARGE (*)    Bacteria, UA MANY (*)    All other components within normal limits  URINE CULTURE  BASIC METABOLIC PANEL WITH GFR  CBC  HCG, QUANTITATIVE, PREGNANCY  BETA HCG QUANT (REF LAB)    EKG: None  Radiology: CT Renal Stone Study Result Date: 10/01/2023 CLINICAL DATA:  Abdominal/flank pain, stone suspected. EXAM: CT ABDOMEN  AND PELVIS WITHOUT CONTRAST TECHNIQUE: Multidetector CT imaging of the abdomen and pelvis was performed following the standard protocol without IV contrast. RADIATION DOSE REDUCTION: This exam was performed according to the departmental dose-optimization program which includes automated exposure control, adjustment of the mA and/or kV according to patient size and/or use of iterative reconstruction technique. COMPARISON:  CT scan abdomen and pelvis from 10/04/2021. FINDINGS: Lower chest: There are subpleural atelectatic changes in the visualized lung bases. No overt consolidation. No pleural effusion. The heart is normal in size. No pericardial effusion. Hepatobiliary: The liver is normal in size. Non-cirrhotic configuration. No suspicious mass. No intrahepatic or extrahepatic bile duct dilation. No calcified gallstones. Normal gallbladder wall thickness. No pericholecystic inflammatory changes. Pancreas: Unremarkable. No pancreatic ductal dilatation or surrounding inflammatory changes. Spleen: Within normal limits. No focal lesion. Adrenals/Urinary Tract: Adrenal glands are unremarkable. No suspicious renal mass within the limitations of this unenhanced exam. There is a bilobed versus 2 adjacent hypoattenuating structure in the right kidney upper pole, not well evaluated on the current exam but grossly similar to the prior study. No nephroureterolithiasis or obstructive uropathy. Urinary bladder is under distended, precluding optimal assessment. However, no large mass or stones identified. No perivesical fat stranding. Stomach/Bowel: No disproportionate dilation of the small or large bowel loops. No evidence of abnormal bowel wall thickening or inflammatory changes. The appendix is unremarkable. There is fecalization of terminal ileum, which may be due to incompetent ileocecal valve or constipation. Vascular/Lymphatic: There is small amount of free fluid in the dependent pelvis, likely physiological in the patient  of this age group. No walled-off abscess or loculated collection. No pneumoperitoneum. No abdominal or pelvic lymphadenopathy, by size criteria. No aneurysmal dilation of the major abdominal arteries. Reproductive: Not well evaluated on the CT scan exam. However, having said that normal-sized anteverted uterus noted. There is T-shaped intrauterine device, which appears oblique in orientation however, grossly similar to the prior study. There is suspected invagination of right short-arm. Correlate with ultrasound pelvis for better evaluation. No large adnexal mass seen. Other: There is a tiny fat containing umbilical hernia. There is also a tiny left inguinal hernia containing ascitic fluid. The soft tissues and abdominal wall are otherwise unremarkable. Musculoskeletal: No suspicious osseous lesions. IMPRESSION: 1. No nephroureterolithiasis or obstructive uropathy. No acute inflammatory process identified within the abdomen or pelvis. 2. There is T-shaped intrauterine device, which appears oblique in orientation however, grossly similar to the prior study. There is suspected invagination of right short-arm. Correlate with ultrasound pelvis for better evaluation. 3. Multiple other nonacute observations, as described above. Electronically Signed   By: Ree Molt M.D.   On: 10/01/2023 10:43  Procedures   Medications Ordered in the ED  morphine  (PF) 4 MG/ML injection 4 mg (4 mg Intravenous Given 10/01/23 0841)  ondansetron  (ZOFRAN ) injection 4 mg (4 mg Intravenous Given 10/01/23 0842)  ketorolac  (TORADOL ) 30 MG/ML injection 30 mg (30 mg Intravenous Given 10/01/23 0927)  cefTRIAXone  (ROCEPHIN ) 1 g in sodium chloride  0.9 % 100 mL IVPB (0 g Intravenous Stopped 10/01/23 1254)  fentaNYL  (SUBLIMAZE ) injection 25 mcg (25 mcg Intravenous Given 10/01/23 1317)                                    Medical Decision Making Amount and/or Complexity of Data Reviewed Labs: ordered. Radiology:  ordered.  Risk Prescription drug management.   This patient presents to the ED for concern of back pain, this involves an extensive number of treatment options, and is a complaint that carries with it a high risk of complications and morbidity.  The differential diagnosis includes pyelonephritis, lumbar strain, urolithiasis, bowel obstruction   Co morbidities that complicate the patient evaluation  Prior pregnancies, asthma, anemia   Lab Tests:  I Ordered, and personally interpreted labs.  The pertinent results include: CBC with no evident leukocytosis or other abnormalities, BMP with no evidence of renal dysfunction, UA consistent with infection with many bacteria, large leukocytes, but no nitrates seen.  Hemoglobin present.   Imaging Studies ordered:  I ordered imaging studies including CT renal stone study I independently visualized and interpreted imaging which showed 1. No nephroureterolithiasis or obstructive uropathy. No acute inflammatory process identified within the abdomen or pelvis. 2. There is T-shaped intrauterine device, which appears oblique in orientation however, grossly similar to the prior study. There is suspected invagination of right short-arm. Correlate with ultrasound pelvis for better evaluation. 3. Multiple other nonacute observations, as described above. I agree with the radiologist interpretation   Consultations Obtained:  I requested consultation with none,  and discussed lab and imaging findings as well as pertinent plan - they recommend: N/A   Problem List / ED Course / Critical interventions / Medication management  Patient past history significant for prior episodes of pyelonephritis presents emergency department concerns of bilateral flank pain.  Reports the symptoms again last night and has not had significant treatment since then.  Endorsing some mild nausea but denies any vomiting.  No reported fever, chills or bodyaches.  States she does have  some urinary discomfort with burning present but denies any obvious hematuria. On exam, patient has bilateral flank tenderness.  Abdomen is soft and nontender.  Normal bowel sounds. Obtain basic labs for possible infectious workup.  UA consistent with signs of infection with bacteria, leukocytes and moderate hemoglobin seen.  No nitrites present.  hCG negative, BMP unremarkable, CBC unremarkable.  Urine culture collected and pending. CT renal stone study negative for any acute findings such as pyelonephritis or urolithiasis.  Given area pain, suspect likely pyelonephritis.  Will discharge with course of antibiotic therapy for the next 10 days.  Initial dose of Rocephin  given here in the emergency department.  Patient otherwise well-controlled at this time.  Return precautions discussed such as concerns for new or worsening symptoms.  Discharged home in stable condition. I ordered medication including morphine , Zofran  for pain, nausea Reevaluation of the patient after these medicines showed that the patient improved I have reviewed the patients home medicines and have made adjustments as needed  Test / Admission - Considered:  Patient stable for  outpatient follow-up.  Final diagnoses:  Pyelonephritis    ED Discharge Orders          Ordered    cefadroxil  (DURICEF) 500 MG capsule  2 times daily        10/01/23 1258    oxyCODONE -acetaminophen  (PERCOCET/ROXICET) 5-325 MG tablet  Every 6 hours PRN        10/01/23 1258               Verena Shawgo A, PA-C 10/01/23 1530    Freddi Hamilton, MD 10/05/23 1500

## 2023-10-01 NOTE — ED Notes (Signed)
Pt given sandwich bag and apple juice. 

## 2023-10-01 NOTE — ED Triage Notes (Signed)
 C/o back pain onset 12 mn c/o burning with urination yest.

## 2023-10-01 NOTE — ED Notes (Signed)
 Patient reports nor relief from morphine , provider notified.

## 2023-10-02 LAB — BETA HCG QUANT (REF LAB): hCG Quant: <1 m[IU]/mL

## 2023-10-04 LAB — URINE CULTURE
Culture: >=100000 — AB
Special Requests: NORMAL

## 2023-10-05 ENCOUNTER — Telehealth (HOSPITAL_BASED_OUTPATIENT_CLINIC_OR_DEPARTMENT_OTHER): Payer: Self-pay

## 2023-10-05 NOTE — Telephone Encounter (Signed)
 Post ED Visit - Positive Culture Follow-up  Culture report reviewed by antimicrobial stewardship pharmacist: [facility] [x]  [place], [place].D. []  Venetia Gully, Pharm.D., BCPS AQ-ID []  Garrel Crews, Pharm.D., BCPS []  Almarie Lunger, Pharm.D., BCPS []  [place], [address].D., BCPS, AAHIVP []  Rosaline Bihari, Pharm.D., BCPS, AAHIVP []  Vernell Meier, PharmD, BCPS []  Latanya Hint, PharmD, BCPS []  Donald Medley, PharmD, BCPS []  Rocky Bold, PharmD []  Dorothyann Alert, PharmD, BCPS []  Morene Babe, PharmD  [facility] []  Rosaline Edison, PharmD []  Romona Bliss, PharmD []  Dolphus Roller, PharmD []  Veva Seip, Rph []  Vernell Daunt) Leonce, PharmD []  Eva Allis, PharmD []  Rosaline Millet, PharmD []  Iantha Batch, PharmD []  Arvin Gauss, PharmD []  Wanda Hasting, PharmD []  Ronal Rav, PharmD []  Rocky Slade, PharmD []  Bard Jeans, PharmD   Positive urine culture Treated with Cefadroxil , organism sensitive to the same and no further patient follow-up is required at this time.  Ruth Camelia Elbe 10/05/2023, 12:33 PM

## 2023-11-13 ENCOUNTER — Ambulatory Visit: Admitting: Nurse Practitioner

## 2023-11-13 ENCOUNTER — Encounter: Payer: Self-pay | Admitting: Nurse Practitioner

## 2023-11-13 ENCOUNTER — Other Ambulatory Visit (HOSPITAL_COMMUNITY)
Admission: RE | Admit: 2023-11-13 | Discharge: 2023-11-13 | Disposition: A | Discharge status: Home / Self Care | Source: Ambulatory Visit | Attending: Nurse Practitioner | Admitting: Nurse Practitioner

## 2023-11-13 VITALS — BP 113/67 | HR 83 | Ht 65.0 in | Wt 223.0 lb

## 2023-11-13 DIAGNOSIS — Z30013 Encounter for initial prescription of injectable contraceptive: Secondary | ICD-10-CM | POA: Diagnosis not present

## 2023-11-13 DIAGNOSIS — Z124 Encounter for screening for malignant neoplasm of cervix: Secondary | ICD-10-CM

## 2023-11-13 DIAGNOSIS — Z01419 Encounter for gynecological examination (general) (routine) without abnormal findings: Secondary | ICD-10-CM

## 2023-11-13 DIAGNOSIS — Z30432 Encounter for removal of intrauterine contraceptive device: Secondary | ICD-10-CM | POA: Diagnosis not present

## 2023-11-13 MED ORDER — MEDROXYPROGESTERONE ACETATE 150 MG/ML IM SUSP
150.0000 mg | Freq: Once | INTRAMUSCULAR | Status: AC
  Administered 2023-11-13: 150 mg via INTRAMUSCULAR

## 2023-11-13 NOTE — Progress Notes (Signed)
 Went to ER. Had CT scan. Told IUD out of place. Pt states causing slight discomfort. Wants IUD removed and possible Nexplanon insertion. Declines STI testing today.

## 2023-11-13 NOTE — Progress Notes (Addendum)
 Subjective:     Sonia James is a 28 y.o. female here at [facility] for a routine exam.  Current complaints: request for IUD removal for malposition.  Personal and family health history reviewed: yes.  Do you have a primary care provider? [facility] Do you feel safe at home?  Yes Flowsheet Row Clinical Support from 04/08/2020 in Center for [facility]  PHQ-2 Total Score 0    Health Maintenance Due  Topic Date Due   Hepatitis C Screening  Never done   Hepatitis B Vaccines 19-59 Average Risk (1 of 3 - 19+ 3-dose series) Never done   HPV VACCINES (1 - 3-dose SCDM series) Never done   COVID-19 Vaccine (1 - 2024-25 season) Never done   Cervical Cancer Screening (Pap smear)  04/28/2023   INFLUENZA VACCINE  10/19/2023     Risk factors for chronic health problems:None Smoking:None Alchohol/how much:None Pt BMI: Body mass index is 37.11 kg/m.   Gynecologic History Patient's last menstrual period was 10/14/2023 (exact date). Contraception: Depo-Provera  injections Last Pap: 04/27/20. Results were: Normal. +trich Last mammogram: N/A. Results were: Not indicated  Obstetric History OB History  Gravida Para Term Preterm AB Living  1 1 1   1   SAB IAB Ectopic Multiple Live Births     0 1    # Outcome Date GA Lbr Len/2nd Weight Sex Type Anes PTL Lv  1 Term 03/29/20 [redacted]w[redacted]d  3345 g F CS-LTranv Spinal  LIV     The following portions of the patient's history were reviewed and updated as appropriate: allergies, current medications, past family history, past medical history, past social history, past surgical history, and problem list.  Review of Systems Pertinent items noted in HPI and remainder of comprehensive ROS otherwise negative.    Objective:   Today's Vitals   11/13/23 1415  BP: 113/67  Pulse: 83  Weight: 101.2 kg  Height: 5' 5 (1.651 m)   Body mass index is 37.11 kg/m.  VS reviewed, nursing note reviewed,   Constitutional: well developed, well nourished, no distress HEENT: normocephalic, thyroid without enlargement or mass HEART: RRR, no murmurs rubs/gallops RESP: clear and equal to auscultation bilaterally in all lobes  Breast Exam:  Deferred with low risks and shared decision making, discussed recommendation to start mammogram between 40-50 yo/ Abdomen: soft Neuro: alert and oriented x 3 Skin: warm, dry Psych: affect normal Pelvic exam: Performed: Cervix pink, visually closed, without lesion, scant white creamy discharge, vaginal walls and external genitalia normal Bimanual exam: Deferred  IUD Removal  Procedure Note Patient was in the dorsal lithotomy position, normal external genitalia was noted.  A speculum was placed in the patient's vagina, normal discharge was noted, no lesions. The multiparous cervix was visualized, no lesions, no abnormal discharge.  The strings of the IUD were grasped and pulled using ring forceps. The IUD was removed in its entirety. Patient tolerated the procedure well.        Assessment/Plan:  1. Well woman exam (Primary) Follow up 1 year for AEX.  2. Cervical cancer screening  - Cytology - PAP( [place])  3. Encounter for IUD removal - IUD removed intact without complications - Patient tolerated procedure well - medroxyPROGESTERone  (DEPO-PROVERA ) injection 150 mg  4. Encounter for initial prescription of injectable contraceptive - medroxyPROGESTERone  (DEPO-PROVERA ) injection 150 mg  - Contraceptive Bridge till able to insert Nexplanon   Derrek JINNY Freund, NP Student 3:06 PM    Attestation  of Supervision of Student:  I confirm that I have verified the information documented in the nurse practitioner student's note and that I have also personally reperformed the history, physical exam and all medical decision making activities.  I have verified that all services and findings are accurately documented in this student's note; and I agree with management  and plan as outlined in the documentation. I have also made any necessary editorial changes.  I was present in the room during the entire encounter confirmed the HPI, removal of IUD and performed pap and physical exam  Olam DELENA Dalton, NP Center for [employer], [facility] 11/13/2023 3:55 PM

## 2023-11-14 LAB — CYTOLOGY - PAP
Diagnosis: NEGATIVE
Diagnosis: REACTIVE

## 2023-11-16 ENCOUNTER — Ambulatory Visit: Payer: Self-pay | Admitting: Nurse Practitioner

## 2023-11-16 DIAGNOSIS — B9689 Other specified bacterial agents as the cause of diseases classified elsewhere: Secondary | ICD-10-CM

## 2023-11-16 DIAGNOSIS — A599 Trichomoniasis, unspecified: Secondary | ICD-10-CM

## 2023-11-16 MED ORDER — METRONIDAZOLE 500 MG PO TABS: 500.0000 mg | ORAL_TABLET | Freq: Two times a day (BID) | ORAL | 0 refills | Status: AC

## 2023-11-28 ENCOUNTER — Other Ambulatory Visit: Payer: Self-pay

## 2023-11-28 ENCOUNTER — Ambulatory Visit: Admitting: Obstetrics and Gynecology

## 2023-11-28 ENCOUNTER — Ambulatory Visit
Admission: EM | Admit: 2023-11-28 | Discharge: 2023-11-28 | Disposition: A | Discharge status: Home / Self Care | Attending: Emergency Medicine | Admitting: Emergency Medicine

## 2023-11-28 ENCOUNTER — Ambulatory Visit (INDEPENDENT_AMBULATORY_CARE_PROVIDER_SITE_OTHER): Admitting: Radiology

## 2023-11-28 DIAGNOSIS — R059 Cough, unspecified: Secondary | ICD-10-CM | POA: Diagnosis not present

## 2023-11-28 MED ORDER — ALBUTEROL SULFATE HFA 108 (90 BASE) MCG/ACT IN AERS
2.0000 | INHALATION_SPRAY | Freq: Once | RESPIRATORY_TRACT | Status: AC
  Administered 2023-11-28: 2 via RESPIRATORY_TRACT

## 2023-11-28 NOTE — ED Triage Notes (Signed)
 Pt presents with a chief complaint of cough x 1 month. Mentions she is coughing up thick, yellow/green mucus. Hx of asthma. Does endorse some SOB. Currently rates overall pain a 7/10. Feels like she is struggling to take a deep breath in. Denies taking OTC medications PTA for symptoms reported. Denies sick contacts. Requesting breathing treatment + prescription for albuterol  inhaler today.

## 2023-11-28 NOTE — ED Provider Notes (Signed)
 [facility]    CSN: [id number] Arrival date & time: 11/28/23  1240      History   Chief Complaint Chief Complaint  Patient presents with   Cough    HPI Sonia James is a 28 y.o. female.   Patient presents to clinic over concerns of an ongoing productive cough that has been present for well over a month now.  Patient reports initially symptoms started with nasal congestion and rhinorrhea and then very quickly developed into a productive cough with thick yellow/green mucus.  Patient reports personal history of asthma with an asthma attack within the last 3 years.  She does not currently have an inhaler and has never been prescribed one.  Is not established with a primary care provider.  Does not smoke.  Has never smoked.  Denies fevers.  Going up stairs causes shortness of breath.  Coughing fits cause shortness of breath as well.  Feels like she is struggling to take a deep breath.  Has not tried medications or interventions at home.  The history is provided by the patient and medical records.  Cough   Past Medical History:  Diagnosis Date   Anemia    Asthma     Patient Active Problem List   Diagnosis Date Noted   Cesarean delivery delivered 03/29/2020   Trichomoniasis 03/29/2020   No prenatal care in current pregnancy 03/29/2020   IUD (intrauterine device) in place 03/29/2020   Breech presentation of fetus 03/29/2020    Past Surgical History:  Procedure Laterality Date   CARPAL TUNNEL RELEASE Right 12/2022   CESAREAN SECTION N/A 03/29/2020   Procedure: CESAREAN SECTION;  Surgeon: Lola Donnice HERO, MD;  Location: [facility];  Service: Obstetrics;  Laterality: N/A;   WRIST GANGLION EXCISION Left 03/2023    OB History     Gravida  1   Para  1   Term  1   Preterm      AB      Living  1      SAB      IAB      Ectopic      Multiple  0   Live Births  1            Home Medications    Prior to Admission medications    Medication Sig Start Date End Date Taking? Authorizing Provider  cephALEXin  (KEFLEX ) 500 MG capsule Take 1 capsule (500 mg total) by mouth 3 (three) times daily. Patient not taking: Reported on 11/13/2023 10/04/21   Horton, Charmaine FALCON, MD  ibuprofen  (ADVIL ) 800 MG tablet Take 1 tablet (800 mg total) by mouth every 8 (eight) hours. Patient not taking: Reported on 11/13/2023 03/31/20   Henriette Mora, MD  metroNIDAZOLE  (FLAGYL ) 500 MG tablet Take 1 tablet (500 mg total) by mouth 2 (two) times daily. 11/16/23   Cooleen, Olam LABOR, NP  ondansetron  (ZOFRAN -ODT) 4 MG disintegrating tablet Take 1 tablet (4 mg total) by mouth every 8 (eight) hours as needed for nausea or vomiting. Patient not taking: Reported on 11/13/2023 05/31/23   Hazen Darryle BRAVO, FNP  oxyCODONE  (OXY IR/ROXICODONE ) 5 MG immediate release tablet Take 5 mg by mouth every 6 (six) hours. Patient not taking: Reported on 11/13/2023 04/05/23   [provider]  oxyCODONE -acetaminophen  (PERCOCET/ROXICET) 5-325 MG tablet Take 1 tablet by mouth every 6 (six) hours as needed for severe pain (pain score 7-10). Patient not taking: Reported on 11/13/2023 10/01/23   Zelaya, Oscar A, PA-C  Prenatal Vit-Fe Fumarate-FA (PRENATAL MULTIVITAMIN) TABS tablet Take 1 tablet by mouth daily at 12 noon. Patient not taking: Reported on 05/31/2023    [provider]    Family History Family History  Problem Relation Age of Onset   Thyroid disease Mother     Social History Social History   Tobacco Use   Smoking status: Never   Smokeless tobacco: Never  Vaping Use   Vaping status: Never Used  Substance Use Topics   Alcohol use: Never   Drug use: No     Allergies   Patient has no known allergies.   Review of Systems Review of Systems  Per HPI  Physical Exam Triage Vital Signs ED Triage Vitals  Encounter Vitals Group     BP 11/28/23 1315 132/78     Girls Systolic BP Percentile --      Girls Diastolic BP Percentile --      Boys  Systolic BP Percentile --      Boys Diastolic BP Percentile --      Pulse Rate 11/28/23 1315 68     Resp 11/28/23 1315 16     Temp 11/28/23 1315 98.3 F (36.8 C)     Temp Source 11/28/23 1315 Oral     SpO2 11/28/23 1315 98 %     Weight 11/28/23 1315 223 lb (101.2 kg)     Height 11/28/23 1315 5' 5 (1.651 m)     Head Circumference --      Peak Flow --      Pain Score 11/28/23 1401 7     Pain Loc --      Pain Education --      Exclude from Growth Chart --    No data found.  Updated Vital Signs BP 132/78 (BP Location: Right Arm)   Pulse 68   Temp 98.3 F (36.8 C) (Oral)   Resp 16   Ht 5' 5 (1.651 m)   Wt 223 lb (101.2 kg)   LMP 11/09/2023 (Approximate)   SpO2 98%   BMI 37.11 kg/m   Visual Acuity Right Eye Distance:   Left Eye Distance:   Bilateral Distance:    Right Eye Near:   Left Eye Near:    Bilateral Near:     Physical Exam Vitals and nursing note reviewed.  Constitutional:      Appearance: Normal appearance.  HENT:     Head: Normocephalic and atraumatic.     Right Ear: External ear normal.     Left Ear: External ear normal.     Nose: Congestion and rhinorrhea present.     Mouth/Throat:     Mouth: Mucous membranes are moist.  Eyes:     Conjunctiva/sclera: Conjunctivae normal.  Cardiovascular:     Rate and Rhythm: Normal rate and regular rhythm.     Heart sounds: Normal heart sounds. No murmur heard. Pulmonary:     Effort: Pulmonary effort is normal. No respiratory distress.     Breath sounds: Normal breath sounds. No wheezing.  Skin:    General: Skin is warm and dry.  Neurological:     General: No focal deficit present.     Mental Status: She is alert and oriented to person, place, and time.  Psychiatric:        Mood and Affect: Mood normal.        Behavior: Behavior normal.      UC Treatments / Results  Labs (all labs ordered are listed, but only abnormal results are  displayed) Labs Reviewed - No data to display  EKG   Radiology No  results found.  Procedures Procedures (including critical care time)  Medications Ordered in UC Medications  albuterol  (VENTOLIN  HFA) 108 (90 Base) MCG/ACT inhaler 2 puff (has no administration in time range)    Initial Impression / Assessment and Plan / UC Course  I have reviewed the triage vital signs and the nursing notes.  Pertinent labs & imaging results that were available during my care of the patient were reviewed by me and considered in my medical decision making (see chart for details).  Notes and triage reviewed, patient is hemodynamically stable.  Lungs vesicular, heart with regular rate and rhythm.  98% on room air, able to speak in full sentences.  Is congested with rhinorrhea.  Chest x-ray by my interpretation does not show infiltrate, low concern for pneumonia.  Will send home with albuterol  inhaler and encouraged PCP follow-up for further evaluation of potential asthma.  Plan of care, follow-up care return precautions given, no questions at this time.    Final Clinical Impressions(s) / UC Diagnoses   Final diagnoses:  Cough, unspecified type     Discharge Instructions      I do not see pneumonia on your chest x-ray, this is reassuring.  Consider taking over-the-counter antihistamine such as Zyrtec daily to help with any allergy  symptoms.  You can use the albuterol  inhaler every 4-6 hours for any wheezing or shortness of breath.  Follow-up with a primary care provider for formal testing of asthma.  Return to clinic for any new or urgent symptoms.     ED Prescriptions   None    PDMP not reviewed this encounter.   Dreama, Kytzia Gienger  N, FNP 11/28/23 1459

## 2023-11-28 NOTE — Discharge Instructions (Signed)
 I do not see pneumonia on your chest x-ray, this is reassuring.  Consider taking over-the-counter antihistamine such as Zyrtec daily to help with any allergy  symptoms.  You can use the albuterol  inhaler every 4-6 hours for any wheezing or shortness of breath.  Follow-up with a primary care provider for formal testing of asthma.  Return to clinic for any new or urgent symptoms.

## 2023-11-29 ENCOUNTER — Encounter: Payer: Self-pay | Admitting: Allergy & Immunology

## 2023-11-29 ENCOUNTER — Ambulatory Visit: Admitting: Allergy & Immunology

## 2023-11-29 VITALS — BP 100/70 | HR 71 | Temp 98.1°F | Ht 63.39 in | Wt 214.7 lb

## 2023-11-29 DIAGNOSIS — J310 Chronic rhinitis: Secondary | ICD-10-CM | POA: Diagnosis not present

## 2023-11-29 DIAGNOSIS — L2089 Other atopic dermatitis: Secondary | ICD-10-CM | POA: Diagnosis not present

## 2023-11-29 DIAGNOSIS — T7803XD Anaphylactic reaction due to other fish, subsequent encounter: Secondary | ICD-10-CM

## 2023-11-29 DIAGNOSIS — T7803XA Anaphylactic reaction due to other fish, initial encounter: Secondary | ICD-10-CM

## 2023-11-29 DIAGNOSIS — J4540 Moderate persistent asthma, uncomplicated: Secondary | ICD-10-CM | POA: Diagnosis not present

## 2023-11-29 MED ORDER — FLUTICASONE PROPIONATE 50 MCG/ACT NA SUSP: 2.0000 | Freq: Every day | NASAL | 5 refills | Status: AC

## 2023-11-29 MED ORDER — LEVOCETIRIZINE DIHYDROCHLORIDE 5 MG PO TABS: 5.0000 mg | ORAL_TABLET | Freq: Every evening | ORAL | 5 refills | Status: AC

## 2023-11-29 MED ORDER — EPINEPHRINE 0.3 MG/0.3ML IJ SOAJ: 0.3000 mg | Freq: Once | INTRAMUSCULAR | 2 refills | Status: AC

## 2023-11-29 MED ORDER — BUDESONIDE-FORMOTEROL FUMARATE 160-4.5 MCG/ACT IN AERO: 2.0000 | INHALATION_SPRAY | Freq: Two times a day (BID) | RESPIRATORY_TRACT | 5 refills | Status: AC

## 2023-11-29 MED ORDER — TACROLIMUS 0.1 % EX OINT: TOPICAL_OINTMENT | Freq: Two times a day (BID) | CUTANEOUS | 3 refills | Status: DC

## 2023-11-29 NOTE — Progress Notes (Signed)
 NEW PATIENT  Date of Service/Encounter:  11/29/23  Consult requested by: Pcp, No   Assessment:   Moderate persistent asthma, uncomplicated  Chronic rhinitis  Seafood anaphylaxis  Flexural atopic dermatitis  Plan/Recommendations:   1. Moderate persistent asthma, uncomplicated - Lung testing looks excellent today. - We are going to start you on a medication that contains a long-acting albuterol  combined with an inhaled steroid. - This will help to control the coughing if it is related to asthma.  - Spacer sample and demonstration provided. - Daily controller medication(s): Symbicort  160/4.34mcg two puffs twice daily with spacer - Prior to physical activity: albuterol  2 puffs 10-15 minutes before physical activity. - Rescue medications: albuterol  4 puffs every 4-6 hours as needed - Asthma control goals:  * Full participation in all desired activities (may need albuterol  before activity) * Albuterol  use two time or less a week on average (not counting use with activity) * Cough interfering with sleep two time or less a month * Oral steroids no more than once a year * No hospitalizations  2. Chronic rhinitis - We are going to do some blood work to look for environmental allergies since we are getting some blood work anyway. - Start taking: Xyzal  (levocetirizine) 5mg  daily. - Start taking: Flonase  (fluticasone ) two sprays per nostril daily. - We can talk about more treatment options once we have the labs back.   3. Eczema  - Continue with Aveeno as you are doing. - Add on tacrolimus  twice daily applied in a thin layer to the worst areas.  - This can be used head to toe (even on the face).  4. Seafood allergy  - We are checking on allergy  levels to seafood. - EpiPen  training provided. - Emergency Action Plan provided.  5. Return in about 2 months (around 01/29/2024). You can have the follow up appointment with Dr. Iva or a Nurse Practicioner (our Nurse Practitioners  are excellent and always have Physician oversight!).   This note in its entirety was forwarded to the Provider who requested this consultation.  Subjective:   Sonia James is a 28 y.o. female presenting today for evaluation of  Chief Complaint  Patient presents with   Establish Care    Allergies  Asthma    Nasal Congestion    Coughing up mucous yellow Excessive sneezing   Cough   Breathing Problem   Sinus Problem   Headache    Corrinne James has a history of the following: Patient Active Problem List   Diagnosis Date Noted   Cesarean delivery delivered 03/29/2020   Trichomoniasis 03/29/2020   No prenatal care in current pregnancy 03/29/2020   IUD (intrauterine device) in place 03/29/2020   Breech presentation of fetus 03/29/2020    History obtained from: chart review and patient.  Discussed the use of AI scribe software for clinical note transcription with the patient and/or guardian, who gave verbal consent to proceed.  Sonia James was referred by Pcp, No.     Sonia James is a 28 y.o. female presenting for an evaluation of asthma and allergies.   Asthma/Respiratory Symptom History: She has been experiencing persistent coughing and breathing difficulties for about a month and a half. The cough is initially dry and leads to chest pain and difficulty breathing deeply. It is present all day and is exacerbated by exercise and colds. Deep breaths trigger the urge to cough. She has a history of asthma, with the last attack occurring approximately three years ago. She was given albuterol  at  an urgent care visit yesterday, but it did not provide relief. She has not used a nebulizer as an adult and does not recall frequent inhaler use as a child, though she had one around fifth grade. She has not been on prednisone for her breathing issues and has not used a nebulizer as an adult.  Allergic Rhinitis Symptom History: She experiences significant congestion frequently throughout the year, often  triggered by seasonal changes. She has tried various over-the-counter allergy  medications such as Benadryl , Zyrtec, and Claritin without significant improvement.  Food Allergy  Symptom History: She avoids fish after experiencing facial swelling in the past.   Skin Symptom History: She also has eczema, primarily affecting her face, for which she uses Aveeno. She recalls seeing a dermatologist as a child due to severe eczema on her neck.  In terms of social history, she works as a Lawyer at [employer] and has a three-year-old daughter who attends daycare. She has an older sister living in [place] .   Otherwise, there is no history of other atopic diseases, including drug allergies, stinging insect allergies, or contact dermatitis. There is no significant infectious history. Vaccinations are up to date.    Past Medical History: Patient Active Problem List   Diagnosis Date Noted   Cesarean delivery delivered 03/29/2020   Trichomoniasis 03/29/2020   No prenatal care in current pregnancy 03/29/2020   IUD (intrauterine device) in place 03/29/2020   Breech presentation of fetus 03/29/2020    Medication List:  Allergies as of 11/29/2023   No Known Allergies      Medication List        Accurate as of November 29, 2023 11:44 AM. If you have any questions, ask your nurse or doctor.          budesonide -formoterol  160-4.5 MCG/ACT inhaler Commonly known as: Symbicort  Inhale 2 puffs into the lungs in the morning and at bedtime. Started by: Marty Morton Shaggy   cephALEXin  500 MG capsule Commonly known as: KEFLEX  Take 1 capsule (500 mg total) by mouth 3 (three) times daily.   EPINEPHrine  0.3 mg/0.3 mL Soaj injection Commonly known as: EpiPen  2-Pak Inject 0.3 mg into the muscle once for 1 dose. Started by: Marty Morton Shaggy   fluticasone  50 MCG/ACT nasal spray Commonly known as: FLONASE  Place 2 sprays into both nostrils daily. Started by: Marty Morton Shaggy   ibuprofen  800 MG  tablet Commonly known as: ADVIL  Take 1 tablet (800 mg total) by mouth every 8 (eight) hours.   levocetirizine 5 MG tablet Commonly known as: XYZAL  Take 1 tablet (5 mg total) by mouth every evening. Started by: Marty Morton Shaggy   metroNIDAZOLE  500 MG tablet Commonly known as: FLAGYL  Take 1 tablet (500 mg total) by mouth 2 (two) times daily.   ondansetron  4 MG disintegrating tablet Commonly known as: ZOFRAN -ODT Take 1 tablet (4 mg total) by mouth every 8 (eight) hours as needed for nausea or vomiting.   oxyCODONE  5 MG immediate release tablet Commonly known as: Oxy IR/ROXICODONE  Take 5 mg by mouth every 6 (six) hours.   oxyCODONE -acetaminophen  5-325 MG tablet Commonly known as: PERCOCET/ROXICET Take 1 tablet by mouth every 6 (six) hours as needed for severe pain (pain score 7-10).   prenatal multivitamin Tabs tablet Take 1 tablet by mouth daily at 12 noon.   tacrolimus  0.1 % ointment Commonly known as: PROTOPIC  Apply topically 2 (two) times daily. Started by: Marty Morton Shaggy        Birth History: non-contributory  Developmental History:  non-contributory  Past Surgical History: Past Surgical History:  Procedure Laterality Date   CARPAL TUNNEL RELEASE Right 12/2022   CESAREAN SECTION N/A 03/29/2020   Procedure: CESAREAN SECTION;  Surgeon: Lola Donnice HERO, MD;  Location: [facility] LD [facility];  Service: Obstetrics;  Laterality: N/A;   WRIST GANGLION EXCISION Left 03/2023     Family History: Family History  Problem Relation Age of Onset   Allergic rhinitis Mother    Asthma Mother    Thyroid disease Mother      Social History: Shalona lives at home with her family.  She lives in an apartment.  There is hardwood in the main living areas and carpeting in the bedroom.  She has electric heating and central cooling.  There are cats inside of the home and cats and dogs outside of the home.  There are no dust mite covers on the bedding.  There is no tobacco exposure.  She  has a 41-year-old daughter.  There is no HEPA filter.  She is exposed to keep fumes, chemicals, and dust.   Review of systems otherwise negative other than that mentioned in the HPI.    Objective:   Blood pressure 100/70, pulse 71, temperature 98.1 F (36.7 C), height 5' 3.39 (1.61 m), weight 214 lb 11.2 oz (97.4 kg), last menstrual period 11/09/2023, SpO2 99%. Body mass index is 37.57 kg/m.     Physical Exam Vitals reviewed.  Constitutional:      Appearance: She is well-developed. She is not ill-appearing or toxic-appearing.  HENT:     Head: Normocephalic and atraumatic.     Right Ear: Tympanic membrane, ear canal and external ear normal. No drainage, swelling or tenderness. Tympanic membrane is not injected, scarred, erythematous, retracted or bulging.     Left Ear: Tympanic membrane, ear canal and external ear normal. No drainage, swelling or tenderness. Tympanic membrane is not injected, scarred, erythematous, retracted or bulging.     Nose: No nasal deformity, septal deviation, mucosal edema or rhinorrhea.     Right Turbinates: Enlarged, swollen and pale.     Left Turbinates: Enlarged, swollen and pale.     Right Sinus: No maxillary sinus tenderness or frontal sinus tenderness.     Left Sinus: No maxillary sinus tenderness or frontal sinus tenderness.     Mouth/Throat:     Lips: Pink.     Mouth: Mucous membranes are moist. Mucous membranes are not pale and not dry.     Pharynx: Uvula midline.  Eyes:     General:        Right eye: No discharge.        Left eye: No discharge.     Conjunctiva/sclera: Conjunctivae normal.     Right eye: Right conjunctiva is not injected. No chemosis.    Left eye: Left conjunctiva is not injected. No chemosis.    Pupils: Pupils are equal, round, and reactive to light.  Cardiovascular:     Rate and Rhythm: Normal rate and regular rhythm.     Heart sounds: Normal heart sounds.  Pulmonary:     Effort: Pulmonary effort is normal. No  tachypnea, accessory muscle usage or respiratory distress.     Breath sounds: Normal breath sounds. No wheezing, rhonchi or rales.  Chest:     Chest wall: No tenderness.  Abdominal:     Tenderness: There is no abdominal tenderness. There is no guarding or rebound.  Lymphadenopathy:     Head:     Right side of head: No submandibular,  tonsillar or occipital adenopathy.     Left side of head: No submandibular, tonsillar or occipital adenopathy.     Cervical: No cervical adenopathy.  Skin:    Coloration: Skin is not pale.     Findings: No abrasion, erythema, petechiae or rash. Rash is not papular, urticarial or vesicular.  Neurological:     Mental Status: She is alert.  Psychiatric:        Behavior: Behavior is cooperative.      Diagnostic studies:    Spirometry: results normal (FEV1: 2.45/91%, FVC: 2.96/94%, FEV1/FVC: 83%).    Spirometry consistent with normal pattern.   Allergy  Studies: none         Marty Shaggy, MD [facility] 

## 2023-11-29 NOTE — Patient Instructions (Addendum)
 1. Moderate persistent asthma, uncomplicated - Lung testing looks excellent today. - We are going to start you on a medication that contains a long-acting albuterol  combined with an inhaled steroid. - This will help to control the coughing if it is related to asthma.  - Spacer sample and demonstration provided. - Daily controller medication(s): Symbicort  160/4.28mcg two puffs twice daily with spacer - Prior to physical activity: albuterol  2 puffs 10-15 minutes before physical activity. - Rescue medications: albuterol  4 puffs every 4-6 hours as needed - Asthma control goals:  * Full participation in all desired activities (may need albuterol  before activity) * Albuterol  use two time or less a week on average (not counting use with activity) * Cough interfering with sleep two time or less a month * Oral steroids no more than once a year * No hospitalizations  2. Chronic rhinitis - We are going to do some blood work to look for environmental allergies since we are getting some blood work anyway. - Start taking: Xyzal  (levocetirizine) 5mg  daily. - Start taking: Flonase  (fluticasone ) two sprays per nostril daily. - We can talk about more treatment options once we have the labs back.   3. Eczema  - Continue with Aveeno as you are doing. - Add on tacrolimus  twice daily applied in a thin layer to the worst areas.  - This can be used head to toe (even on the face).  4. Seafood allergy  - We are checking on allergy  levels to seafood. - EpiPen  training provided. - Emergency Action Plan provided.  5. Return in about 2 months (around 01/29/2024). You can have the follow up appointment with Dr. Iva or a Nurse Practicioner (our Nurse Practitioners are excellent and always have Physician oversight!).    Please inform us  of any Emergency Department visits, hospitalizations, or changes in symptoms. Call us  before going to the ED for breathing or allergy  symptoms since we might be able to fit you  in for a sick visit. Feel free to contact us  anytime with any questions, problems, or concerns.  It was a pleasure to meet you today!  Websites that have reliable patient information: 1. American Academy of Asthma, Allergy , and Immunology: www.aaaai.org 2. Food Allergy  Research and Education (FARE): foodallergy.org 3. Mothers of Asthmatics: http://www.asthmacommunitynetwork.org 4. [employer] of Allergy , Asthma, and Immunology: www.acaai.org      "Like" us  on Facebook and Instagram for our latest updates!      A healthy democracy works best when [employer] participate! Make sure you are registered to vote! If you have moved or changed any of your contact information, you will need to get this updated before voting! Scan the QR codes below to learn more!

## 2023-11-30 ENCOUNTER — Other Ambulatory Visit (HOSPITAL_COMMUNITY): Payer: Self-pay

## 2023-11-30 ENCOUNTER — Telehealth: Payer: Self-pay

## 2023-11-30 NOTE — Telephone Encounter (Signed)
 Prior auth questions answered and sent to [employer] [place]  [employer].

## 2023-11-30 NOTE — Telephone Encounter (Signed)
*  AA  Pharmacy Patient Advocate Encounter   Received notification from CoverMyMeds that prior authorization for Tacrolimus  0.1% ointment is required/requested.   Insurance verification completed.   The patient is insured through [employer] .   Per test claim: PA required; PA started via CoverMyMeds. KEY [id number] . Please see clinical question(s) below that I am not finding the answer to in their chart and advise.   Has the patient tried and failed at least one prescription topical corticosteroid?

## 2023-11-30 NOTE — Telephone Encounter (Signed)
*  AA  Pharmacy Patient Advocate Encounter   Received notification from CoverMyMeds that prior authorization for EPINEPHrine  0.3MG /0.3ML auto-injectors  is required/requested.   Insurance verification completed.   The patient is insured through [employer] .   Per test claim: The current 30 day co-pay is, $4.00.  No PA needed at this time. This test claim was processed through [facility]- copay amounts may vary at other pharmacies due to pharmacy/plan contracts, or as the patient moves through the different stages of their insurance plan.

## 2023-12-03 LAB — ANCA TITERS
Atypical pANCA: <1:20 {titer}
C-ANCA: <1:20 {titer}
P-ANCA: <1:20 {titer}

## 2023-12-03 LAB — CBC WITH DIFFERENTIAL/PLATELET
Basophils Absolute: 0 x10E3/uL (ref 0.0–0.2)
Basos: 1 %
EOS (ABSOLUTE): 0.1 x10E3/uL (ref 0.0–0.4)
Eos: 1 %
Hematocrit: 41 % (ref 34.0–46.6)
Hemoglobin: 13.1 g/dL (ref 11.1–15.9)
Immature Grans (Abs): 0 x10E3/uL (ref 0.0–0.1)
Immature Granulocytes: 0 %
Lymphocytes Absolute: 2.2 x10E3/uL (ref 0.7–3.1)
Lymphs: 49 %
MCH: 30 pg (ref 26.6–33.0)
MCHC: 32 g/dL (ref 31.5–35.7)
MCV: 94 fL (ref 79–97)
Monocytes Absolute: 0.4 x10E3/uL (ref 0.1–0.9)
Monocytes: 9 %
Neutrophils Absolute: 1.7 x10E3/uL (ref 1.4–7.0)
Neutrophils: 40 %
Platelets: 331 x10E3/uL (ref 150–450)
RBC: 4.37 x10E6/uL (ref 3.77–5.28)
RDW: 14.6 % (ref 11.7–15.4)
WBC: 4.4 x10E3/uL (ref 3.4–10.8)

## 2023-12-03 LAB — ALLERGY PANEL 19, SEAFOOD GROUP
Allergen Salmon IgE: <0.1 kU/L
Catfish: <0.1 kU/L
Codfish IgE: <0.1 kU/L
F023-IgE Crab: <0.1 kU/L
F080-IgE Lobster: <0.1 kU/L
Shrimp IgE: <0.1 kU/L
Tuna: <0.1 kU/L

## 2023-12-03 LAB — ALLERGENS W/COMP RFLX AREA 2
Alternaria Alternata IgE: <0.1 kU/L
Aspergillus Fumigatus IgE: <0.1 kU/L
Bermuda Grass IgE: 0.45 kU/L — AB
Cedar, Mountain IgE: <0.1 kU/L
Cladosporium Herbarum IgE: <0.1 kU/L
Cockroach, German IgE: <0.1 kU/L
Common Silver Birch IgE: <0.1 kU/L
Cottonwood IgE: <0.1 kU/L
D Farinae IgE: <0.1 kU/L
D Pteronyssinus IgE: <0.1 kU/L
E001-IgE Cat Dander: <0.1 kU/L
E005-IgE Dog Dander: <0.1 kU/L
Elm, American IgE: <0.1 kU/L
IgE (Immunoglobulin E), Serum: 18 [IU]/mL (ref 6–495)
Johnson Grass IgE: 2.21 kU/L — AB
Maple/Box Elder IgE: 0.15 kU/L — AB
Mouse Urine IgE: <0.1 kU/L
Oak, White IgE: <0.1 kU/L
Pecan, Hickory IgE: <0.1 kU/L
Penicillium Chrysogen IgE: <0.1 kU/L
Pigweed, Rough IgE: <0.1 kU/L
Ragweed, Short IgE: <0.1 kU/L
Sheep Sorrel IgE Qn: <0.1 kU/L
Timothy Grass IgE: 5.04 kU/L — AB
White Mulberry IgE: <0.1 kU/L

## 2023-12-03 LAB — TRYPTASE: Tryptase: 4 ug/L (ref 2.2–13.2)

## 2023-12-03 LAB — ASPERGILLUS PRECIPITINS
A.Fumigatus #1 Abs: NEGATIVE
Aspergillus Flavus Antibodies: NEGATIVE
Aspergillus Niger Antibodies: NEGATIVE
Aspergillus glaucus IgG: NEGATIVE
Aspergillus nidulans IgG: NEGATIVE
Aspergillus terreus IgG: NEGATIVE

## 2023-12-03 LAB — ALPHA-1-ANTITRYPSIN: A-1 Antitrypsin: 180 mg/dL (ref 100–188)

## 2023-12-03 NOTE — Telephone Encounter (Signed)
 [facility] reviewed your TACROLIMUS  0.1% OINTMENT request for the above-identified member, and it is denied for the following reason: because we did not see what we need to approve the drug you asked for, (tacrolimus  0.1% ointment). We may be able to approve this drug in certain situations (when you have tried a certain type of drug [at least one prescription topical corticosteroid]). We do not see that this applies to you. We based this decision on your health plan's prior authorization clinical criteria named Anti-Inflammatory Medications.

## 2023-12-03 NOTE — Addendum Note (Signed)
 Addended by: NANCEE JON SAILOR on: 12/03/2023 12:22 PM   Modules accepted: Orders

## 2023-12-04 MED ORDER — TACROLIMUS 0.03 % EX OINT: TOPICAL_OINTMENT | Freq: Two times a day (BID) | CUTANEOUS | 5 refills | Status: AC | PRN

## 2023-12-04 NOTE — Telephone Encounter (Signed)
 I will try the lower dose tacrolimus  instead.

## 2023-12-04 NOTE — Addendum Note (Signed)
 Addended by: IVA MARTY SALTNESS on: 12/04/2023 08:45 AM   Modules accepted: Orders

## 2023-12-06 ENCOUNTER — Telehealth: Payer: Self-pay

## 2023-12-06 ENCOUNTER — Ambulatory Visit: Payer: Self-pay | Admitting: Allergy & Immunology

## 2023-12-06 NOTE — Telephone Encounter (Signed)
*  AA  Pharmacy Patient Advocate Encounter   Received notification from CoverMyMeds that prior authorization for Tacrolimus  0.03% ointment   is required/requested.   Insurance verification completed.   The patient is insured through [employer] .   Per test claim: PA required; PA started via CoverMyMeds. KEY [id number] . Please see clinical question(s) below that I am not finding the answer to in their chart and advise.   Has the patient tried and failed at least one prescription topical corticosteroid?

## 2023-12-07 NOTE — Telephone Encounter (Signed)
Additional information submitted to plan

## 2023-12-07 NOTE — Telephone Encounter (Signed)
 Your request has been approved PA Case: [id number], Status: Approved, Coverage Starts on: 12/07/2023 12:00:00 AM, Coverage Ends on: 12/06/2024 12:00:00 AM. Authorization Expiration09/19/2026

## 2023-12-07 NOTE — Telephone Encounter (Signed)
 She has been on prescription strength hydrocortisone in the past. She was seen by Dermatology when she was a child, but I do not have those records.

## 2023-12-26 ENCOUNTER — Ambulatory Visit (INDEPENDENT_AMBULATORY_CARE_PROVIDER_SITE_OTHER): Admitting: Family Medicine

## 2023-12-26 ENCOUNTER — Encounter: Payer: Self-pay | Admitting: Family Medicine

## 2023-12-26 ENCOUNTER — Other Ambulatory Visit: Payer: Self-pay

## 2023-12-26 VITALS — BP 128/72 | HR 106 | Wt 214.4 lb

## 2023-12-26 DIAGNOSIS — Z30017 Encounter for initial prescription of implantable subdermal contraceptive: Secondary | ICD-10-CM

## 2023-12-26 DIAGNOSIS — Z23 Encounter for immunization: Secondary | ICD-10-CM

## 2023-12-26 MED ORDER — ETONOGESTREL 68 MG ~~LOC~~ IMPL
68.0000 mg | DRUG_IMPLANT | Freq: Once | SUBCUTANEOUS | Status: AC
  Administered 2023-12-26: 68 mg via SUBCUTANEOUS

## 2023-12-26 NOTE — Progress Notes (Signed)
     GYNECOLOGY OFFICE PROCEDURE NOTE  Sonia James is a 28 y.o. G1P1001 here for Nexplanon insertion.  Last pap smear was on 11/13/2023 and was normal.  No other gynecologic concerns.  Nexplanon Insertion Procedure Patient identified, informed consent performed, consent signed.   Patient does understand that irregular bleeding is a very common side effect of this medication. Discussed other risks and benefits of this contraception modality. She was advised to have backup contraception for one week after placement. Pregnancy test in clinic today was negative.  Appropriate time out taken.  Patient's left arm was prepped and draped in the usual sterile fashion. The ruler used to measure and mark insertion area.  Patient was prepped with alcohol swab and then injected with 3 ml of 1% lidocaine.  She was prepped with betadine, Nexplanon removed from packaging,  Device confirmed in needle, then inserted full length of needle and withdrawn per handbook instructions. Nexplanon was able to palpated in the patient's arm; patient palpated the insert herself. There was minimal blood loss.  Patient insertion site covered with guaze and a pressure bandage to reduce any bruising.  The patient tolerated the procedure well and was given post procedure instructions.   Influenza vaccine needed - Plan: Flu vaccine trivalent PF, 6mos and older(Flulaval,Afluria,Fluarix,Fluzone)  Nexplanon insertion   Sonia GORMAN Birk, MD 12/26/2023 9:08 AM

## 2024-03-20 ENCOUNTER — Ambulatory Visit: Payer: Self-pay

## 2024-03-20 ENCOUNTER — Emergency Department (HOSPITAL_COMMUNITY)
Admission: EM | Admit: 2024-03-20 | Discharge: 2024-03-20 | Disposition: A | Discharge status: Home / Self Care | Attending: Emergency Medicine | Admitting: Emergency Medicine

## 2024-03-20 ENCOUNTER — Emergency Department (HOSPITAL_COMMUNITY)

## 2024-03-20 ENCOUNTER — Other Ambulatory Visit: Payer: Self-pay

## 2024-03-20 DIAGNOSIS — R509 Fever, unspecified: Secondary | ICD-10-CM | POA: Diagnosis not present

## 2024-03-20 DIAGNOSIS — R0981 Nasal congestion: Secondary | ICD-10-CM | POA: Insufficient documentation

## 2024-03-20 DIAGNOSIS — J45909 Unspecified asthma, uncomplicated: Secondary | ICD-10-CM | POA: Insufficient documentation

## 2024-03-20 DIAGNOSIS — R197 Diarrhea, unspecified: Secondary | ICD-10-CM | POA: Insufficient documentation

## 2024-03-20 DIAGNOSIS — R112 Nausea with vomiting, unspecified: Secondary | ICD-10-CM | POA: Insufficient documentation

## 2024-03-20 DIAGNOSIS — J111 Influenza due to unidentified influenza virus with other respiratory manifestations: Secondary | ICD-10-CM

## 2024-03-20 DIAGNOSIS — R059 Cough, unspecified: Secondary | ICD-10-CM | POA: Diagnosis not present

## 2024-03-20 LAB — URINALYSIS, ROUTINE W REFLEX MICROSCOPIC
Bacteria, UA: NONE SEEN
Bilirubin Urine: NEGATIVE
Glucose, UA: NEGATIVE mg/dL
Ketones, ur: 20 mg/dL — AB
Leukocytes,Ua: NEGATIVE
Nitrite: NEGATIVE
Protein, ur: >=300 mg/dL — AB
Specific Gravity, Urine: 1.027 (ref 1.005–1.030)
pH: 5 (ref 5.0–8.0)

## 2024-03-20 LAB — CBC
HCT: 40.6 % (ref 36.0–46.0)
Hemoglobin: 14.1 g/dL (ref 12.0–15.0)
MCH: 30 pg (ref 26.0–34.0)
MCHC: 34.7 g/dL (ref 30.0–36.0)
MCV: 86.4 fL (ref 80.0–100.0)
Platelets: 342 K/uL (ref 150–400)
RBC: 4.7 MIL/uL (ref 3.87–5.11)
RDW: 14.3 % (ref 11.5–15.5)
WBC: 10.2 K/uL (ref 4.0–10.5)
nRBC: 0 % (ref 0.0–0.2)

## 2024-03-20 LAB — COMPREHENSIVE METABOLIC PANEL WITH GFR
ALT: 15 U/L (ref 0–44)
AST: 24 U/L (ref 15–41)
Albumin: 4.6 g/dL (ref 3.5–5.0)
Alkaline Phosphatase: 117 U/L (ref 38–126)
Anion gap: 18 — ABNORMAL HIGH (ref 5–15)
BUN: 15 mg/dL (ref 6–20)
CO2: 17 mmol/L — ABNORMAL LOW (ref 22–32)
Calcium: 9.5 mg/dL (ref 8.9–10.3)
Chloride: 102 mmol/L (ref 98–111)
Creatinine, Ser: 1.09 mg/dL — ABNORMAL HIGH (ref 0.44–1.00)
GFR, Estimated: >60 mL/min
Glucose, Bld: 111 mg/dL — ABNORMAL HIGH (ref 70–99)
Potassium: 3.5 mmol/L (ref 3.5–5.1)
Sodium: 137 mmol/L (ref 135–145)
Total Bilirubin: 0.4 mg/dL (ref 0.0–1.2)
Total Protein: 9 g/dL — ABNORMAL HIGH (ref 6.5–8.1)

## 2024-03-20 LAB — LIPASE, BLOOD: Lipase: 12 U/L (ref 11–51)

## 2024-03-20 LAB — HCG, SERUM, QUALITATIVE: Preg, Serum: NEGATIVE

## 2024-03-20 MED ORDER — SODIUM CHLORIDE 0.9 % IV BOLUS
1000.0000 mL | Freq: Once | INTRAVENOUS | Status: AC
  Administered 2024-03-20: 1000 mL via INTRAVENOUS

## 2024-03-20 MED ORDER — METOCLOPRAMIDE HCL 5 MG/ML IJ SOLN
10.0000 mg | Freq: Once | INTRAMUSCULAR | Status: AC
  Administered 2024-03-20: 10 mg via INTRAVENOUS
  Filled 2024-03-20: qty 2

## 2024-03-20 MED ORDER — ONDANSETRON 4 MG PO TBDP
4.0000 mg | ORAL_TABLET | Freq: Once | ORAL | Status: AC | PRN
  Administered 2024-03-20: 4 mg via ORAL
  Filled 2024-03-20: qty 1

## 2024-03-20 MED ORDER — PROMETHAZINE HCL 25 MG PO TABS: 25.0000 mg | ORAL_TABLET | Freq: Four times a day (QID) | ORAL | 0 refills | Status: AC | PRN

## 2024-03-20 MED ORDER — IPRATROPIUM-ALBUTEROL 0.5-2.5 (3) MG/3ML IN SOLN
3.0000 mL | Freq: Once | RESPIRATORY_TRACT | Status: AC
  Administered 2024-03-20: 3 mL via RESPIRATORY_TRACT
  Filled 2024-03-20: qty 3

## 2024-03-20 MED ORDER — DIPHENHYDRAMINE HCL 50 MG/ML IJ SOLN
25.0000 mg | Freq: Once | INTRAMUSCULAR | Status: AC
  Administered 2024-03-20: 25 mg via INTRAVENOUS
  Filled 2024-03-20: qty 1

## 2024-03-20 MED ORDER — NAPROXEN 375 MG PO TABS: 375.0000 mg | ORAL_TABLET | Freq: Two times a day (BID) | ORAL | 0 refills | Status: AC

## 2024-03-20 NOTE — ED Triage Notes (Signed)
 Patient reports emesis with diarrhea and occasional productive cough this week , no fever or chills .

## 2024-03-20 NOTE — Discharge Instructions (Addendum)
 Please follow-up closely with your primary care doctor on an outpatient basis.  Return to emergency department immediately for any new or worsening symptoms.

## 2024-03-20 NOTE — ED Provider Notes (Signed)
 " [facility] DE[facility] AT [facility] Provider Note   CSN: [id number] Arrival date & time: 03/20/24  9484     Patient presents with: Emesis   Sonia James is a 29 y.o. female.   Patient is a 29 year old female who presents to the emergency department the chief complaint of rhinorrhea, cough, congestion, fever, chills, body aches, nausea, vomiting which has been ongoing for approximate the past 5 days.  Patient notes that she has been taking over-the-counter medications with only minimal improvement in her symptoms.  She has had a difficult time tolerating p.o. intake secondary to the vomiting.  She has been taking Zofran  at home with only minimal relief.  She denies any chest pain or shortness of breath.  She does have a history of asthma.   Emesis Associated symptoms: chills, cough and fever        Prior to Admission medications  Medication Sig Start Date End Date Taking? Authorizing Provider  budesonide -formoterol  (SYMBICORT ) 160-4.5 MCG/ACT inhaler Inhale 2 puffs into the lungs in the morning and at bedtime. 11/29/23   Iva Marty Saltness, MD  cephALEXin  (KEFLEX ) 500 MG capsule Take 1 capsule (500 mg total) by mouth 3 (three) times daily. Patient not taking: Reported on 12/26/2023 10/04/21   Horton, Charmaine FALCON, MD  fluticasone  (FLONASE ) 50 MCG/ACT nasal spray Place 2 sprays into both nostrils daily. 11/29/23   Iva Marty Saltness, MD  ibuprofen  (ADVIL ) 800 MG tablet Take 1 tablet (800 mg total) by mouth every 8 (eight) hours. Patient not taking: Reported on 12/26/2023 03/31/20   Mahoney, Caitlin, MD  levocetirizine (XYZAL ) 5 MG tablet Take 1 tablet (5 mg total) by mouth every evening. 11/29/23   Iva Marty Saltness, MD  metroNIDAZOLE  (FLAGYL ) 500 MG tablet Take 1 tablet (500 mg total) by mouth 2 (two) times daily. Patient not taking: Reported on 12/26/2023 11/16/23   Littie Olam LABOR, NP  ondansetron  (ZOFRAN -ODT) 4 MG disintegrating tablet Take 1 tablet (4 mg total)  by mouth every 8 (eight) hours as needed for nausea or vomiting. Patient not taking: Reported on 12/26/2023 05/31/23   Hazen Darryle BRAVO, FNP  oxyCODONE  (OXY IR/ROXICODONE ) 5 MG immediate release tablet Take 5 mg by mouth every 6 (six) hours. Patient not taking: Reported on 12/26/2023 04/05/23   [provider]  oxyCODONE -acetaminophen  (PERCOCET/ROXICET) 5-325 MG tablet Take 1 tablet by mouth every 6 (six) hours as needed for severe pain (pain score 7-10). Patient not taking: Reported on 12/26/2023 10/01/23   Zelaya, Oscar A, PA-C  Prenatal Vit-Fe Fumarate-FA (PRENATAL MULTIVITAMIN) TABS tablet Take 1 tablet by mouth daily at 12 noon. Patient not taking: Reported on 12/26/2023    [provider]  tacrolimus  (PROTOPIC ) 0.03 % ointment Apply topically 2 (two) times daily as needed. 12/04/23   Iva Marty Saltness, MD    Allergies: Patient has no known allergies.    Review of Systems  Constitutional:  Positive for chills and fever.  Respiratory:  Positive for cough.   Gastrointestinal:  Positive for nausea and vomiting.  All other systems reviewed and are negative.   Updated Vital Signs BP 123/68 (BP Location: Right Arm)   Pulse 92   Temp 97.6 F (36.4 C)   Resp 18   SpO2 96%   Physical Exam Vitals and nursing note reviewed.  Constitutional:      General: She is not in acute distress.    Appearance: Normal appearance. She is not ill-appearing.  HENT:     Head: Normocephalic and  atraumatic.     Nose: Nose normal.     Mouth/Throat:     Mouth: Mucous membranes are moist.  Eyes:     Extraocular Movements: Extraocular movements intact.     Conjunctiva/sclera: Conjunctivae normal.     Pupils: Pupils are equal, round, and reactive to light.  Cardiovascular:     Rate and Rhythm: Normal rate and regular rhythm.     Pulses: Normal pulses.     Heart sounds: Normal heart sounds. No murmur heard.    No gallop.  Pulmonary:     Effort: Pulmonary effort is normal. No respiratory  distress.     Breath sounds: No stridor. Wheezing present. No rhonchi or rales.  Abdominal:     General: Abdomen is flat. Bowel sounds are normal. There is no distension.     Palpations: Abdomen is soft.     Tenderness: There is no abdominal tenderness. There is no guarding.  Musculoskeletal:        General: Normal range of motion.     Cervical back: Normal range of motion and neck supple.  Skin:    General: Skin is warm and dry.  Neurological:     General: No focal deficit present.     Mental Status: She is alert and oriented to person, place, and time. Mental status is at baseline.  Psychiatric:        Mood and Affect: Mood normal.        Behavior: Behavior normal.        Thought Content: Thought content normal.        Judgment: Judgment normal.     (all labs ordered are listed, but only abnormal results are displayed) Labs Reviewed  COMPREHENSIVE METABOLIC PANEL WITH GFR - Abnormal; Notable for the following components:      Result Value   CO2 17 (*)    Glucose, Bld 111 (*)    Creatinine, Ser 1.09 (*)    Total Protein 9.0 (*)    Anion gap 18 (*)    All other components within normal limits  URINALYSIS, ROUTINE W REFLEX MICROSCOPIC - Abnormal; Notable for the following components:   APPearance CLOUDY (*)    Hgb urine dipstick SMALL (*)    Ketones, ur 20 (*)    Protein, ur >=300 (*)    All other components within normal limits  LIPASE, BLOOD  CBC  HCG, SERUM, QUALITATIVE    EKG: None  Radiology: No results found.   Procedures   Medications Ordered in the ED  sodium chloride  0.9 % bolus 1,000 mL (has no administration in time range)  metoCLOPramide (REGLAN) injection 10 mg (has no administration in time range)  diphenhydrAMINE  (BENADRYL ) injection 25 mg (has no administration in time range)  ipratropium-albuterol  (DUONEB) 0.5-2.5 (3) MG/3ML nebulizer solution 3 mL (has no administration in time range)  ondansetron  (ZOFRAN -ODT) disintegrating tablet 4 mg (4 mg  Oral Given 03/20/24 0534)                                    Medical Decision Making Amount and/or Complexity of Data Reviewed Labs: ordered. Radiology: ordered.  Risk Prescription drug management.   This patient presents to the ED for concern of cough, congestion, rhinorrhea, fever, chills, nausea, vomiting differential diagnosis includes acute viral syndrome, influenza, COVID-19, RSV, acute appendicitis, cholecystitis, subluxation, diverticulitis, urinary tract infection, pancreatitis    Additional history obtained:  Additional history obtained  from none External records from outside source obtained and reviewed including none   Lab Tests:  I Ordered, and personally interpreted labs.  The pertinent results include: No leukocytosis, no anemia, mild elevation in creatinine, normal liver function, normal electrolytes, unremarkable urinalysis   Imaging Studies ordered:  I ordered imaging studies including chest x-ray I independently visualized and interpreted imaging which showed no acute cardiopulmonary process I agree with the radiologist interpretation   Medicines ordered and prescription drug management:  I ordered medication including Reglan, Benadryl , IV fluids, DuoNeb for nausea, vomiting, dehydration Reevaluation of the patient after these medicines showed that the patient improved I have reviewed the patients home medicines and have made adjustments as needed   Problem List / ED Course:  Patient is doing much better at this time and notes that symptoms have greatly improved with treatment in the emergency department.  Do suspect dehydration as she was mildly acidotic and had a mild AKI on presentation.  She has been urinating without difficulty though.  Suspect that this still may be secondary to influenza.  Chest x-ray demonstrated no indication for acute consolidation.  Blood work is otherwise unremarkable.  Do not suspect that admission is warranted at this time.   Abdominal exam is benign with no focal tenderness throughout do not suspect an acute intra-abdominal surgical process.  Urinalysis demonstrated no indication for urinary tract infection.  Will continue symptomatic treatment on an outpatient basis.  Close follow-up with primary care doctor was discussed as well as strict turn precautions for any new or worsening symptoms.  Patient voiced understanding and had no additional questions.   Social Determinants of Health:  None        Final diagnoses:  None    ED Discharge Orders     None          Daralene Lonni JONETTA DEVONNA 03/20/24 1921    Patt Alm Macho, MD 03/20/24 2227  "

## 2024-04-03 ENCOUNTER — Other Ambulatory Visit: Payer: Self-pay | Admitting: Orthopaedic Surgery

## 2024-04-03 DIAGNOSIS — M79671 Pain in right foot: Secondary | ICD-10-CM

## 2024-04-22 ENCOUNTER — Ambulatory Visit
Admission: RE | Admit: 2024-04-22 | Discharge: 2024-04-22 | Disposition: A | Source: Ambulatory Visit | Attending: Orthopaedic Surgery

## 2024-04-22 DIAGNOSIS — M79671 Pain in right foot: Secondary | ICD-10-CM

## 2024-05-21 ENCOUNTER — Encounter (HOSPITAL_BASED_OUTPATIENT_CLINIC_OR_DEPARTMENT_OTHER): Payer: Self-pay

## 2024-05-21 ENCOUNTER — Ambulatory Visit (HOSPITAL_BASED_OUTPATIENT_CLINIC_OR_DEPARTMENT_OTHER): Admit: 2024-05-21 | Admitting: Orthopaedic Surgery
